# Patient Record
Sex: Female | Born: 1995 | Race: Black or African American | Hispanic: No | Marital: Single | Smoking: Never smoker
Health system: Southern US, Community
[De-identification: ages and names within clinical notes are randomized; demographics above are authoritative.]

## PROBLEM LIST (undated history)

## (undated) DIAGNOSIS — N200 Calculus of kidney: Secondary | ICD-10-CM

## (undated) DIAGNOSIS — A749 Chlamydial infection, unspecified: Secondary | ICD-10-CM

---

## 2019-02-10 ENCOUNTER — Ambulatory Visit (HOSPITAL_COMMUNITY): Payer: Self-pay

## 2019-02-10 ENCOUNTER — Encounter (HOSPITAL_COMMUNITY): Payer: Self-pay

## 2019-02-10 ENCOUNTER — Other Ambulatory Visit: Payer: Self-pay | Admitting: Surgical

## 2019-02-10 ENCOUNTER — Other Ambulatory Visit (HOSPITAL_COMMUNITY): Payer: Self-pay | Admitting: Surgical

## 2019-02-10 DIAGNOSIS — N200 Calculus of kidney: Secondary | ICD-10-CM

## 2019-02-23 ENCOUNTER — Encounter (HOSPITAL_COMMUNITY): Payer: Self-pay | Admitting: *Deleted

## 2019-02-23 ENCOUNTER — Ambulatory Visit (HOSPITAL_COMMUNITY)
Admission: EM | Admit: 2019-02-23 | Discharge: 2019-02-23 | Disposition: A | Discharge status: Home / Self Care | Payer: Medicaid Other | Attending: Emergency Medicine | Admitting: Emergency Medicine

## 2019-02-23 ENCOUNTER — Other Ambulatory Visit: Payer: Self-pay

## 2019-02-23 DIAGNOSIS — N739 Female pelvic inflammatory disease, unspecified: Secondary | ICD-10-CM | POA: Diagnosis not present

## 2019-02-23 DIAGNOSIS — Z202 Contact with and (suspected) exposure to infections with a predominantly sexual mode of transmission: Secondary | ICD-10-CM | POA: Diagnosis present

## 2019-02-23 DIAGNOSIS — N730 Acute parametritis and pelvic cellulitis: Secondary | ICD-10-CM | POA: Insufficient documentation

## 2019-02-23 DIAGNOSIS — Z3202 Encounter for pregnancy test, result negative: Secondary | ICD-10-CM

## 2019-02-23 HISTORY — DX: Chlamydial infection, unspecified: A74.9

## 2019-02-23 LAB — POCT URINALYSIS DIP (DEVICE)
Bilirubin Urine: NEGATIVE
Glucose, UA: NEGATIVE mg/dL
Ketones, ur: 15 mg/dL — AB
Leukocytes,Ua: NEGATIVE
Nitrite: NEGATIVE
Protein, ur: NEGATIVE mg/dL
Specific Gravity, Urine: >=1.03 (ref 1.005–1.030)
Urobilinogen, UA: 0.2 mg/dL (ref 0.0–1.0)
pH: 5.5 (ref 5.0–8.0)

## 2019-02-23 LAB — POCT PREGNANCY, URINE: Preg Test, Ur: NEGATIVE

## 2019-02-23 MED ORDER — METRONIDAZOLE 500 MG PO TABS: 500.0000 mg | ORAL_TABLET | Freq: Two times a day (BID) | ORAL | 0 refills | Status: DC

## 2019-02-23 MED ORDER — CEFTRIAXONE SODIUM 250 MG IJ SOLR
250.0000 mg | Freq: Once | INTRAMUSCULAR | Status: AC
  Administered 2019-02-23: 250 mg via INTRAMUSCULAR

## 2019-02-23 MED ORDER — DOXYCYCLINE HYCLATE 100 MG PO CAPS: 100.0000 mg | ORAL_CAPSULE | Freq: Two times a day (BID) | ORAL | 0 refills | Status: DC

## 2019-02-23 MED ORDER — LIDOCAINE HCL (PF) 1 % IJ SOLN
INTRAMUSCULAR | Status: AC
  Filled 2019-02-23: qty 2

## 2019-02-23 MED ORDER — CEFTRIAXONE SODIUM 250 MG IJ SOLR
INTRAMUSCULAR | Status: AC
  Filled 2019-02-23: qty 250

## 2019-02-23 NOTE — ED Triage Notes (Signed)
C/O gradual onset pelvic pain after waking this AM.  Had chlamydia dx 8/13 - completed course; states all sxs resolved.  Denies any vaginal discharge.  Starting yesterday c/o urinary urgency.  Denies dysuria or fever.  States recently had kidney stones, but stones were passed.

## 2019-02-23 NOTE — ED Provider Notes (Signed)
[facility]    CSN: [id number] Arrival date & time: 02/23/19  1118      History   Chief Complaint Chief Complaint  Patient presents with  . Pelvic Pain    HPI Curly Shoreslon Dancel is a 23 y.o. female.   Patient presents with pelvic pain and urinary urgency for 1 week.  She was treated for chlamydia on 02/06/2019 by a provider in [place]; she is unsure of the name of the medication(s).  [facility] on 01/21/2019 for kidney stone and UTI with Keflex.  Patient states she has an appointment with her GYN on 03/05/2019 in [place].  She denies vaginal discharge, painful urination, abdominal pain, back pain, fever, or other symptoms.  LMP: 02/11/2019.  Patient states she is sexually active with multiple partners and does not use condoms.     Past Medical History:  Diagnosis Date  . Chlamydia     There are no active problems to display for this patient.   History reviewed. No pertinent surgical history.  OB History   No obstetric history on file.      Home Medications    Prior to Admission medications   Medication Sig Start Date End Date Taking? Authorizing Provider  UNKNOWN TO PATIENT BCPs   Yes [provider]  doxycycline (VIBRAMYCIN) 100 MG capsule Take 1 capsule (100 mg total) by mouth 2 (two) times daily. 02/23/19   Mickie Bailate, Kerith Sherley H, NP  metroNIDAZOLE (FLAGYL) 500 MG tablet Take 1 tablet (500 mg total) by mouth 2 (two) times daily. 02/23/19   Mickie Bailate, Andi Layfield H, NP    Family History Family History  Problem Relation Age of Onset  . Healthy Mother   . Healthy Father     Social History Social History   Tobacco Use  . Smoking status: Never Smoker  . Smokeless tobacco: Never Used  Substance Use Topics  . Alcohol use: Yes    Comment: rarely  . Drug use: Yes    Types: Marijuana     Allergies   Patient has no known allergies.   Review of Systems Review of Systems  Constitutional: Negative for chills and fever.  HENT: Negative for ear pain and  sore throat.   Eyes: Negative for pain and visual disturbance.  Respiratory: Negative for cough and shortness of breath.   Cardiovascular: Negative for chest pain and palpitations.  Gastrointestinal: Negative for abdominal pain and vomiting.  Genitourinary: Positive for pelvic pain and urgency. Negative for dysuria, flank pain, frequency, hematuria and vaginal discharge.  Musculoskeletal: Negative for arthralgias and back pain.  Skin: Negative for color change and rash.  Neurological: Negative for seizures and syncope.  All other systems reviewed and are negative.    Physical Exam Triage Vital Signs ED Triage Vitals  Enc Vitals Group     BP 02/23/19 1129 122/76     Pulse Rate 02/23/19 1129 (!) 105     Resp 02/23/19 1129 16     Temp 02/23/19 1129 97.8 F (36.6 C)     Temp Source 02/23/19 1129 Other     SpO2 02/23/19 1129 99 %     Weight --      Height --      Head Circumference --      Peak Flow --      Pain Score 02/23/19 1131 5     Pain Loc --      Pain Edu? --      Excl. in GC? --  No data found.  Updated Vital Signs BP 122/76   Pulse (!) 105   Temp 97.8 F (36.6 C) (Other (Comment))   Resp 16   LMP 02/11/2019 (Exact Date)   SpO2 99%   Visual Acuity Right Eye Distance:   Left Eye Distance:   Bilateral Distance:    Right Eye Near:   Left Eye Near:    Bilateral Near:     Physical Exam Vitals signs and nursing note reviewed.  Constitutional:      General: She is not in acute distress.    Appearance: She is well-developed.  HENT:     Head: Normocephalic and atraumatic.     Mouth/Throat:     Mouth: Mucous membranes are moist.     Pharynx: Oropharynx is clear.  Eyes:     Conjunctiva/sclera: Conjunctivae normal.  Neck:     Musculoskeletal: Neck supple.  Cardiovascular:     Rate and Rhythm: Normal rate and regular rhythm.     Heart sounds: No murmur.  Pulmonary:     Effort: Pulmonary effort is normal. No respiratory distress.     Breath sounds:  Normal breath sounds.  Abdominal:     General: Bowel sounds are normal.     Palpations: Abdomen is soft.     Tenderness: There is no abdominal tenderness. There is no right CVA tenderness, left CVA tenderness, guarding or rebound.  Genitourinary:    Labia:        Right: No rash, tenderness or lesion.        Left: No rash, tenderness or lesion.      Vagina: Vaginal discharge present.     Cervix: Normal.     Uterus: Normal.      Adnexa:        Right: Tenderness present.        Left: Tenderness present.      Comments: Moderate amount of thin white vaginal discharge. Skin:    General: Skin is warm and dry.  Neurological:     Mental Status: She is alert.      UC Treatments / Results  Labs (all labs ordered are listed, but only abnormal results are displayed) Labs Reviewed  POCT URINALYSIS DIP (DEVICE) - Abnormal; Notable for the following components:      Result Value   Ketones, ur 15 (*)    Hgb urine dipstick MODERATE (*)    All other components within normal limits  POC URINE PREG, ED  POCT PREGNANCY, URINE  CERVICOVAGINAL ANCILLARY ONLY    EKG   Radiology No results found.  Procedures Procedures (including critical care time)  Medications Ordered in UC Medications  cefTRIAXone (ROCEPHIN) injection 250 mg (has no administration in time range)  cefTRIAXone (ROCEPHIN) 250 MG injection (has no administration in time range)  lidocaine (PF) (XYLOCAINE) 1 % injection (has no administration in time range)    Initial Impression / Assessment and Plan / UC Course  I have reviewed the triage vital signs and the nursing notes.  Pertinent labs & imaging results that were available during my care of the patient were reviewed by me and considered in my medical decision making (see chart for details).   Urine pregnancy negative.  Urine dip negative for LE and nitrite but has moderate blood.  Acute pelvic inflammatory disease.  Possible exposure to STD.  Treating with  Rocephin, doxycycline, metronidazole.  Instructed patient not to have sex for 7 days.  Discussed with her that her STD test are pending and  that we will call her if they are positive.  Discussed that she may need additional treatment and her partner may also need treatment at that time.  Instructed patient to follow-up with her GYN in [place] as scheduled on 03/05/2019.  Patient agrees with treatment plan.     Final Clinical Impressions(s) / UC Diagnoses   Final diagnoses:  PID (acute pelvic inflammatory disease)  Possible exposure to STD     Discharge Instructions     Given an injection of an antibiotic called Rocephin today.    Take the prescribed doxycycline and metronidazole.  Do not have sex for 7 days.    Your STD tests are pending.  If your test results are positive, we will call you.  You may need additional treatment and your partner may also need treatment.      Follow-up with your GYN as scheduled on 03/05/2019.       ED Prescriptions    Medication Sig Dispense Auth. Provider   doxycycline (VIBRAMYCIN) 100 MG capsule Take 1 capsule (100 mg total) by mouth 2 (two) times daily. 20 capsule Mickie Bail, NP   metroNIDAZOLE (FLAGYL) 500 MG tablet Take 1 tablet (500 mg total) by mouth 2 (two) times daily. 14 tablet Mickie Bail, NP     Controlled Substance Prescriptions [place] Controlled Substance Registry consulted? Not Applicable   Mickie Bail, NP 02/23/19 1207

## 2019-02-23 NOTE — Discharge Instructions (Addendum)
Given an injection of an antibiotic called Rocephin today.    Take the prescribed doxycycline and metronidazole.  Do not have sex for 7 days.    Your STD tests are pending.  If your test results are positive, we will call you.  You may need additional treatment and your partner may also need treatment.      Follow-up with your GYN as scheduled on 03/05/2019.

## 2019-02-28 LAB — CERVICOVAGINAL ANCILLARY ONLY
Bacterial vaginitis: NEGATIVE
Candida vaginitis: NEGATIVE
Chlamydia: NEGATIVE
Neisseria Gonorrhea: NEGATIVE
Trichomonas: NEGATIVE

## 2020-11-30 ENCOUNTER — Encounter (HOSPITAL_COMMUNITY): Payer: Self-pay

## 2020-11-30 ENCOUNTER — Other Ambulatory Visit: Payer: Self-pay

## 2020-11-30 ENCOUNTER — Ambulatory Visit (HOSPITAL_COMMUNITY)
Admission: EM | Admit: 2020-11-30 | Discharge: 2020-11-30 | Disposition: A | Discharge status: Home / Self Care | Payer: Medicaid Other | Attending: Family Medicine | Admitting: Family Medicine

## 2020-11-30 DIAGNOSIS — Z113 Encounter for screening for infections with a predominantly sexual mode of transmission: Secondary | ICD-10-CM | POA: Diagnosis not present

## 2020-11-30 DIAGNOSIS — R300 Dysuria: Secondary | ICD-10-CM | POA: Diagnosis not present

## 2020-11-30 LAB — POCT URINALYSIS DIPSTICK, ED / UC
Bilirubin Urine: NEGATIVE
Glucose, UA: NEGATIVE mg/dL
Ketones, ur: 40 mg/dL — AB
Nitrite: NEGATIVE
Protein, ur: NEGATIVE mg/dL
Specific Gravity, Urine: 1.015 (ref 1.005–1.030)
Urobilinogen, UA: 0.2 mg/dL (ref 0.0–1.0)
pH: 6 (ref 5.0–8.0)

## 2020-11-30 LAB — POC URINE PREG, ED: Preg Test, Ur: NEGATIVE

## 2020-11-30 MED ORDER — CEPHALEXIN 500 MG PO CAPS: 500.0000 mg | ORAL_CAPSULE | Freq: Two times a day (BID) | ORAL | 0 refills | Status: DC

## 2020-11-30 NOTE — ED Triage Notes (Signed)
Pt states she thinks she had a UTI. She states she started taking AZO on Sunday and has felt relief. Pt states she felt pain and tingling after urinating and states she has had the urgency to go constantly.

## 2020-12-01 ENCOUNTER — Telehealth (HOSPITAL_COMMUNITY): Payer: Self-pay | Admitting: Emergency Medicine

## 2020-12-01 LAB — CERVICOVAGINAL ANCILLARY ONLY
Bacterial Vaginitis (gardnerella): POSITIVE — AB
Candida Glabrata: NEGATIVE
Candida Vaginitis: NEGATIVE
Chlamydia: POSITIVE — AB
Comment: NEGATIVE
Comment: NEGATIVE
Comment: NEGATIVE
Comment: NEGATIVE
Comment: NEGATIVE
Comment: NORMAL
Neisseria Gonorrhea: NEGATIVE
Trichomonas: NEGATIVE

## 2020-12-01 MED ORDER — METRONIDAZOLE 0.75 % VA GEL: 1.0000 | Freq: Every day | VAGINAL | 0 refills | Status: AC

## 2020-12-01 MED ORDER — DOXYCYCLINE HYCLATE 100 MG PO CAPS: 100.0000 mg | ORAL_CAPSULE | Freq: Two times a day (BID) | ORAL | 0 refills | Status: AC

## 2020-12-02 NOTE — ED Provider Notes (Addendum)
[facility]    CSN: [id number] Arrival date & time: 11/30/20  1942      History   Chief Complaint No chief complaint on file.   HPI Katherine Bailey is a 25 y.o. female.   Patient presenting today with several day history of urinary urgency and frequency.  Some mild tingling after urinating several times but not consistently.  She states that she went on vacation and upon returning had the symptoms.  Took some Azo for several days which did help with symptoms but they came back after she stopped.  Denies fever, chills, pelvic or abdominal pain, flank pain, nausea vomiting or diarrhea.  No concern for STDs today.     Past Medical History:  Diagnosis Date   Chlamydia     There are no problems to display for this patient.   History reviewed. No pertinent surgical history.  OB History   No obstetric history on file.      Home Medications    Prior to Admission medications   Medication Sig Start Date End Date Taking? Authorizing Provider  doxycycline (VIBRAMYCIN) 100 MG capsule Take 1 capsule (100 mg total) by mouth 2 (two) times daily for 7 days. 12/01/20 12/08/20  Merrilee Jansky, MD  metroNIDAZOLE (FLAGYL) 500 MG tablet Take 1 tablet (500 mg total) by mouth 2 (two) times daily. 02/23/19   Mickie Bail, NP  metroNIDAZOLE (METROGEL VAGINAL) 0.75 % vaginal gel Place 1 Applicatorful vaginally at bedtime for 5 days. 12/01/20 12/06/20  Lamptey, Britta Mccreedy, MD  UNKNOWN TO PATIENT BCPs    [provider]    Family History Family History  Problem Relation Age of Onset   Healthy Mother    Healthy Father     Social History Social History   Tobacco Use   Smoking status: Never   Smokeless tobacco: Never  Vaping Use   Vaping Use: Never used  Substance Use Topics   Alcohol use: Yes    Comment: rarely   Drug use: Yes    Types: Marijuana     Allergies   Patient has no known allergies.   Review of Systems Review of Systems Per HPI  Physical  Exam Triage Vital Signs ED Triage Vitals  Enc Vitals Group     BP 11/30/20 2008 136/90     Pulse Rate 11/30/20 2008 (!) 110     Resp 11/30/20 2008 19     Temp 11/30/20 2008 99 F (37.2 C)     Temp Source 11/30/20 2008 Oral     SpO2 11/30/20 2008 98 %     Weight --      Height --      Head Circumference --      Peak Flow --      Pain Score 11/30/20 2007 0     Pain Loc --      Pain Edu? --      Excl. in GC? --    No data found.  Updated Vital Signs BP 136/90 (BP Location: Left Arm)   Pulse (!) 110   Temp 99 F (37.2 C) (Oral)   Resp 19   LMP 10/06/2014 Comment: Pt is on birth control currently  SpO2 98%   Visual Acuity Right Eye Distance:   Left Eye Distance:   Bilateral Distance:    Right Eye Near:   Left Eye Near:    Bilateral Near:     Physical Exam Vitals and nursing note reviewed.  Constitutional:  Appearance: Normal appearance. She is not ill-appearing.  HENT:     Head: Atraumatic.     Mouth/Throat:     Mouth: Mucous membranes are moist.     Pharynx: Oropharynx is clear.  Eyes:     Extraocular Movements: Extraocular movements intact.     Conjunctiva/sclera: Conjunctivae normal.  Cardiovascular:     Rate and Rhythm: Normal rate and regular rhythm.     Heart sounds: Normal heart sounds.  Pulmonary:     Effort: Pulmonary effort is normal. No respiratory distress.     Breath sounds: Normal breath sounds. No wheezing or rales.  Abdominal:     General: Bowel sounds are normal. There is no distension.     Palpations: Abdomen is soft.     Tenderness: There is no abdominal tenderness. There is no right CVA tenderness, left CVA tenderness or guarding.  Genitourinary:    Comments: GU exam deferred, self swab performed Musculoskeletal:        General: Normal range of motion.     Cervical back: Normal range of motion and neck supple.  Skin:    General: Skin is warm and dry.  Neurological:     Mental Status: She is alert and oriented to person, place,  and time.  Psychiatric:        Mood and Affect: Mood normal.        Thought Content: Thought content normal.        Judgment: Judgment normal.   UC Treatments / Results  Labs (all labs ordered are listed, but only abnormal results are displayed) Labs Reviewed  POCT URINALYSIS DIPSTICK, ED / UC - Abnormal; Notable for the following components:      Result Value   Ketones, ur 40 (*)    Hgb urine dipstick SMALL (*)    Leukocytes,Ua SMALL (*)    All other components within normal limits  CERVICOVAGINAL ANCILLARY ONLY - Abnormal; Notable for the following components:   Bacterial Vaginitis (gardnerella) Positive (*)    Chlamydia Positive (*)    All other components within normal limits  POC URINE PREG, ED    EKG   Radiology No results found.  Procedures Procedures (including critical care time)  Medications Ordered in UC Medications - No data to display  Initial Impression / Assessment and Plan / UC Course  I have reviewed the triage vital signs and the nursing notes.  Pertinent labs & imaging results that were available during my care of the patient were reviewed by me and considered in my medical decision making (see chart for details).     Mildly tachycardic in triage but otherwise vital signs reassuring.  Exam benign.  Urine pregnancy negative.  UA today showing leukocytes and hemoglobin, urine culture pending to further differentiate if this is a urinary tract infection or possibly a vaginal infection.  Vaginal swab also pending to rule out STDs and other vaginal infections.  Will initiate Keflex in the meantime in case treat urinary tract infection.  Discussed abstinence until results return, good vaginal hygiene.  Follow-up if acutely worsening in the meantime.  Final Clinical Impressions(s) / UC Diagnoses   Final diagnoses:  Dysuria   Discharge Instructions   None    ED Prescriptions     Medication Sig Dispense Auth. Provider   cephALEXin (KEFLEX) 500 MG  capsule Take 1 capsule (500 mg total) by mouth 2 (two) times daily. 10 capsule Particia Nearing, [place]      PDMP not reviewed this encounter.  Particia Nearing, [place] 12/02/20 1533    Particia Nearing, [place] 12/02/20 1534

## 2020-12-13 ENCOUNTER — Ambulatory Visit (HOSPITAL_COMMUNITY)
Admission: EM | Admit: 2020-12-13 | Discharge: 2020-12-13 | Disposition: A | Discharge status: Home / Self Care | Payer: Medicaid Other | Attending: Emergency Medicine | Admitting: Emergency Medicine

## 2020-12-13 ENCOUNTER — Other Ambulatory Visit: Payer: Self-pay

## 2020-12-13 ENCOUNTER — Encounter (HOSPITAL_COMMUNITY): Payer: Self-pay

## 2020-12-13 DIAGNOSIS — Z113 Encounter for screening for infections with a predominantly sexual mode of transmission: Secondary | ICD-10-CM | POA: Insufficient documentation

## 2020-12-13 DIAGNOSIS — R0981 Nasal congestion: Secondary | ICD-10-CM | POA: Insufficient documentation

## 2020-12-13 MED ORDER — SEREVENT DISKUS 50 MCG/DOSE IN AEPB: 1.0000 | INHALATION_SPRAY | Freq: Two times a day (BID) | RESPIRATORY_TRACT | 12 refills | Status: DC

## 2020-12-13 NOTE — ED Triage Notes (Signed)
Pt presents for STD retesting after being treated for chlamydia X 2 weeks ago.  Pt also presents with congestion that causes chest discomfort; pt states her kids recently had RSV

## 2020-12-13 NOTE — Discharge Instructions (Addendum)
Sti screen pending 2-3 days, you will be called ifb positive for treatment, no sex until lab results   Respiratory panel pending you will be called if positive   Can use inhaler 1 puff twice a day as needed for shortness of breath   Continue use of cold medications to help with congestion

## 2020-12-14 LAB — RESPIRATORY PANEL BY PCR

## 2020-12-14 LAB — CERVICOVAGINAL ANCILLARY ONLY
Bacterial Vaginitis (gardnerella): NEGATIVE
Candida Glabrata: NEGATIVE
Candida Vaginitis: NEGATIVE
Chlamydia: NEGATIVE
Comment: NEGATIVE
Comment: NEGATIVE
Comment: NEGATIVE
Comment: NEGATIVE
Comment: NEGATIVE
Comment: NORMAL
Neisseria Gonorrhea: NEGATIVE
Trichomonas: NEGATIVE

## 2020-12-14 NOTE — ED Provider Notes (Signed)
[facility]    CSN: [id number] Arrival date & time: 12/13/20  1925      History   Chief Complaint Chief Complaint  Patient presents with   STD Testing   Congestion    HPI Katherine Bailey is a 25 y.o. female.   Patient presents with nasal congestion, ear fullness, chest tightness, intermittent shortness of breath for 3 days. Feels she isn't getting enough air due to the congestion. Denies fever, chills, body aches, headache, sore throat, cough, abdominal pain, nausea, vomiting, diarrhea. 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 Has been taking cold and flu medication with some relief. Unvaccinated.   Requesting STD retesting after treatment for chlamydia. Completed medication as prescribed. Has not had sex since treatment.  No symptoms. 1 partner, no condom use.   Past Medical History:  Diagnosis Date   Chlamydia     There are no problems to display for this patient.   History reviewed. No pertinent surgical history.  OB History   No obstetric history on file.      Home Medications    Prior to Admission medications   Medication Sig Start Date End Date Taking? Authorizing Provider  salmeterol (SEREVENT DISKUS) 50 MCG/DOSE diskus inhaler Inhale 1 puff into the lungs 2 (two) times daily. 12/13/20  Yes Hien Perreira R, NP  metroNIDAZOLE (FLAGYL) 500 MG tablet Take 1 tablet (500 mg total) by mouth 2 (two) times daily. 02/23/19   Mickie Bail, NP  UNKNOWN TO PATIENT BCPs    [provider]    Family History Family History  Problem Relation Age of Onset   Healthy Mother    Healthy Father     Social History Social History   Tobacco Use   Smoking status: Never   Smokeless tobacco: Never  Vaping Use   Vaping Use: Never used  Substance Use Topics   Alcohol use: Yes    Comment: rarely   Drug use: Yes    Types: Marijuana     Allergies   Patient has no known allergies.   Review of Systems Review of Systems Defer to HPI     Physical Exam Triage Vital Signs ED Triage Vitals  Enc Vitals Group     BP 12/13/20 1947 121/82     Pulse Rate 12/13/20 1947 (!) 102     Resp 12/13/20 1947 18     Temp 12/13/20 1947 98.8 F (37.1 C)     Temp Source 12/13/20 1947 Oral     SpO2 12/13/20 1947 100 %     Weight --      Height --      Head Circumference --      Peak Flow --      Pain Score 12/13/20 1948 4     Pain Loc --      Pain Edu? --      Excl. in GC? --    No data found.  Updated Vital Signs BP 121/82 (BP Location: Right Arm)   Pulse (!) 102   Temp 98.8 F (37.1 C) (Oral)   Resp 18   LMP 12/07/2020   SpO2 100%   Visual Acuity Right Eye Distance:   Left Eye Distance:   Bilateral Distance:    Right Eye Near:   Left Eye Near:    Bilateral Near:     Physical Exam Constitutional:      Appearance: Normal appearance. She is normal weight.  HENT:     Head: Normocephalic.  Right Ear: Tympanic membrane, ear canal and external ear normal.     Left Ear: Tympanic membrane, ear canal and external ear normal.     Nose: Congestion present. No rhinorrhea.     Mouth/Throat:     Mouth: Mucous membranes are moist.     Pharynx: Oropharynx is clear.  Eyes:     Extraocular Movements: Extraocular movements intact.  Cardiovascular:     Rate and Rhythm: Normal rate and regular rhythm.     Pulses: Normal pulses.     Heart sounds: Normal heart sounds.  Pulmonary:     Effort: Pulmonary effort is normal.     Breath sounds: Normal breath sounds.  Musculoskeletal:        General: Normal range of motion.     Cervical back: Normal range of motion and neck supple.  Skin:    General: Skin is warm and dry.  Neurological:     Mental Status: She is alert and oriented to person, place, and time. Mental status is at baseline.  Psychiatric:        Mood and Affect: Mood normal.        Behavior: Behavior normal.     UC Treatments / Results  Labs (all labs ordered are listed, but only abnormal results are  displayed) Labs Reviewed  RESPIRATORY PANEL BY PCR - Abnormal; Notable for the following components:      Result Value   Respiratory Syncytial Virus DETECTED (*)    All other components within normal limits  CERVICOVAGINAL ANCILLARY ONLY    EKG   Radiology No results found.  Procedures Procedures (including critical care time)  Medications Ordered in UC Medications - No data to display  Initial Impression / Assessment and Plan / UC Course  I have reviewed the triage vital signs and the nursing notes.  Pertinent labs & imaging results that were available during my care of the patient were reviewed by me and considered in my medical decision making (see chart for details).  Nasal congestion Routine screening for STI  Respiratory panel- pending Salmeterol inhaler bid prn Sti screening pending, will treat per protocol, advised abstinence until labs result Continue use of otc medications   Final Clinical Impressions(s) / UC Diagnoses   Final diagnoses:  Routine screening for STI (sexually transmitted infection)  Nasal congestion     Discharge Instructions      Sti screen pending 2-3 days, you will be called ifb positive for treatment, no sex until lab results   Respiratory panel pending you will be called if positive   Can use inhaler 1 puff twice a day as needed for shortness of breath   Continue use of cold medications to help with congestion    ED Prescriptions     Medication Sig Dispense Auth. Provider   salmeterol (SEREVENT DISKUS) 50 MCG/DOSE diskus inhaler Inhale 1 puff into the lungs 2 (two) times daily. 1 each Valinda Hoar, NP      PDMP not reviewed this encounter.   Valinda Hoar, [place] 12/14/20 [phone]

## 2021-02-27 ENCOUNTER — Ambulatory Visit (HOSPITAL_COMMUNITY)
Admission: EM | Admit: 2021-02-27 | Discharge: 2021-02-27 | Disposition: A | Discharge status: Home / Self Care | Payer: Medicaid Other | Attending: Internal Medicine | Admitting: Internal Medicine

## 2021-02-27 ENCOUNTER — Encounter (HOSPITAL_COMMUNITY): Payer: Self-pay | Admitting: *Deleted

## 2021-02-27 DIAGNOSIS — Z113 Encounter for screening for infections with a predominantly sexual mode of transmission: Secondary | ICD-10-CM

## 2021-02-27 DIAGNOSIS — R109 Unspecified abdominal pain: Secondary | ICD-10-CM | POA: Diagnosis present

## 2021-02-27 DIAGNOSIS — R059 Cough, unspecified: Secondary | ICD-10-CM

## 2021-02-27 LAB — POCT URINALYSIS DIPSTICK, ED / UC
Glucose, UA: NEGATIVE mg/dL
Ketones, ur: >=160 mg/dL — AB
Leukocytes,Ua: NEGATIVE
Nitrite: NEGATIVE
Protein, ur: NEGATIVE mg/dL
Specific Gravity, Urine: 1.02 (ref 1.005–1.030)
Urobilinogen, UA: 0.2 mg/dL (ref 0.0–1.0)
pH: 6 (ref 5.0–8.0)

## 2021-02-27 LAB — POC URINE PREG, ED: Preg Test, Ur: NEGATIVE

## 2021-02-27 MED ORDER — BENZONATATE 100 MG PO CAPS: 100.0000 mg | ORAL_CAPSULE | Freq: Three times a day (TID) | ORAL | 0 refills | Status: DC

## 2021-02-27 MED ORDER — TAMSULOSIN HCL 0.4 MG PO CAPS: 0.4000 mg | ORAL_CAPSULE | Freq: Every day | ORAL | 0 refills | Status: DC

## 2021-02-27 MED ORDER — FLUTICASONE PROPIONATE 50 MCG/ACT NA SUSP: 2.0000 | Freq: Every day | NASAL | 0 refills | Status: DC

## 2021-02-27 NOTE — ED Provider Notes (Signed)
[facility]    CSN: [id number] Arrival date & time: 02/27/21  1720      History   Chief Complaint Chief Complaint  Patient presents with   Cough   SEXUALLY TRANSMITTED DISEASE    HPI Katherine Bailey is a 25 y.o. female.   HPI  Cough: Pt reports a cough for the past few weeks off and on.  She states that the cough will go away and then comes back.  She denies any hemoptysis, fevers, shortness of breath, wheezing or chest pain.  She has tried TheraFlu for symptoms along with Mucinex without any relief.  She reports that she is not a smoker.  She also reports that she has had some intermittent right sided flank pain which is not currently present.  No dysuria, abdominal pain, vomiting or diarrhea.  She also asks for an STI test without symptoms or exposures.   Past Medical History:  Diagnosis Date   Chlamydia     There are no problems to display for this patient.   History reviewed. No pertinent surgical history.  OB History   No obstetric history on file.      Home Medications    Prior to Admission medications   Medication Sig Start Date End Date Taking? Authorizing Provider  benzonatate (TESSALON) 100 MG capsule Take 1 capsule (100 mg total) by mouth every 8 (eight) hours. 02/27/21  Yes Tc Kapusta M, PA-C  fluticasone [facility]) 50 MCG/ACT nasal spray Place 2 sprays into both nostrils daily. 02/27/21  Yes Braian Tijerina, Maralyn Sago M, PA-C  tamsulosin (FLOMAX) 0.4 MG CAPS capsule Take 1 capsule (0.4 mg total) by mouth daily after supper. 02/27/21  Yes Anner Baity M, PA-C  metroNIDAZOLE (FLAGYL) 500 MG tablet Take 1 tablet (500 mg total) by mouth 2 (two) times daily. 02/23/19   Mickie Bail, NP  salmeterol (SEREVENT DISKUS) 50 MCG/DOSE diskus inhaler Inhale 1 puff into the lungs 2 (two) times daily. 12/13/20   White, Elita Boone, NP  UNKNOWN TO PATIENT BCPs    [provider]    Family History Family History  Problem Relation Age of Onset   Healthy Mother     Healthy Father     Social History Social History   Tobacco Use   Smoking status: Never   Smokeless tobacco: Never  Vaping Use   Vaping Use: Never used  Substance Use Topics   Alcohol use: Yes    Comment: rarely   Drug use: Yes    Types: Marijuana     Allergies   Patient has no known allergies.   Review of Systems Review of Systems  As stated above in HPI Physical Exam Triage Vital Signs ED Triage Vitals [02/27/21 1810]  Enc Vitals Group     BP      Pulse      Resp      Temp      Temp src      SpO2      Weight      Height      Head Circumference      Peak Flow      Pain Score 7     Pain Loc      Pain Edu?      Excl. in GC?    No data found.  Updated Vital Signs BP 124/79 (BP Location: Left Arm)   Pulse 89   Temp 99 F (37.2 C) (Oral)   Resp 18   LMP 12/07/2020 Comment: BCP  SpO2 98%   Physical Exam Vitals and nursing note reviewed.  Constitutional:      General: She is not in acute distress.    Appearance: Normal appearance. She is not ill-appearing, toxic-appearing or diaphoretic.  HENT:     Head: Normocephalic and atraumatic.     Right Ear: Tympanic membrane normal.     Left Ear: Tympanic membrane normal.     Nose: Nose normal. No congestion or rhinorrhea.     Mouth/Throat:     Mouth: Mucous membranes are moist.     Pharynx: Oropharynx is clear. No oropharyngeal exudate or posterior oropharyngeal erythema.  Eyes:     Extraocular Movements: Extraocular movements intact.     Pupils: Pupils are equal, round, and reactive to light.  Cardiovascular:     Rate and Rhythm: Normal rate and regular rhythm.     Heart sounds: Normal heart sounds.  Pulmonary:     Effort: Pulmonary effort is normal.     Breath sounds: Normal breath sounds.  Abdominal:     General: Bowel sounds are normal. There is no distension.     Palpations: Abdomen is soft. There is no mass.     Tenderness: There is no abdominal tenderness. There is no right CVA tenderness,  left CVA tenderness, guarding or rebound.     Hernia: No hernia is present.  Musculoskeletal:     Cervical back: Normal range of motion and neck supple. No tenderness.  Lymphadenopathy:     Cervical: No cervical adenopathy.  Skin:    General: Skin is warm.  Neurological:     Mental Status: She is alert and oriented to person, place, and time.     UC Treatments / Results  Labs (all labs ordered are listed, but only abnormal results are displayed) Labs Reviewed  POCT URINALYSIS DIPSTICK, ED / UC - Abnormal; Notable for the following components:      Result Value   Bilirubin Urine SMALL (*)    Ketones, ur >=160 (*)    Hgb urine dipstick MODERATE (*)    All other components within normal limits  POC URINE PREG, ED  CERVICOVAGINAL ANCILLARY ONLY    EKG   Radiology No results found.  Procedures Procedures (including critical care time)  Medications Ordered in UC Medications - No data to display  Initial Impression / Assessment and Plan / UC Course  I have reviewed the triage vital signs and the nursing notes.  Pertinent labs & imaging results that were available during my care of the patient were reviewed by me and considered in my medical decision making (see chart for details).     New.  Sending in Hope and [place] for her to start for her cough.  Should cough fail to improve or she have any fevers, shortness of breath she should come to our office, the ER or see a pulmonologist.  UA suggestive of possible stone- starting on flomax and fluids.  STI test pending Final Clinical Impressions(s) / UC Diagnoses   Final diagnoses:  Cough  Routine screening for STI (sexually transmitted infection)  Flank pain   Discharge Instructions   None    ED Prescriptions     Medication Sig Dispense Auth. Provider   benzonatate (TESSALON) 100 MG capsule Take 1 capsule (100 mg total) by mouth every 8 (eight) hours. 21 capsule Lenzie Sandler M, PA-C   fluticasone [facility])  50 MCG/ACT nasal spray Place 2 sprays into both nostrils daily. 16 mL Rushie Chestnut, [place]  tamsulosin (FLOMAX) 0.4 MG CAPS capsule Take 1 capsule (0.4 mg total) by mouth daily after supper. 30 capsule Rushie Chestnut, [place]      PDMP not reviewed this encounter.   Rushie Chestnut, [place] 02/27/21 1835

## 2021-02-27 NOTE — ED Triage Notes (Signed)
Pt reports Rt side pain and recurrent cough and wants a STD test

## 2021-03-01 LAB — CERVICOVAGINAL ANCILLARY ONLY
Bacterial Vaginitis (gardnerella): POSITIVE — AB
Candida Glabrata: NEGATIVE
Candida Vaginitis: NEGATIVE
Chlamydia: NEGATIVE
Comment: NEGATIVE
Comment: NEGATIVE
Comment: NEGATIVE
Comment: NEGATIVE
Comment: NEGATIVE
Comment: NORMAL
Neisseria Gonorrhea: NEGATIVE
Trichomonas: NEGATIVE

## 2021-03-01 LAB — URINE CULTURE

## 2021-03-04 ENCOUNTER — Telehealth (HOSPITAL_COMMUNITY): Payer: Self-pay

## 2021-03-04 MED ORDER — METRONIDAZOLE 500 MG PO TABS: 500.0000 mg | ORAL_TABLET | Freq: Two times a day (BID) | ORAL | 0 refills | Status: DC

## 2021-04-05 ENCOUNTER — Ambulatory Visit (HOSPITAL_COMMUNITY)
Admission: EM | Admit: 2021-04-05 | Discharge: 2021-04-05 | Disposition: A | Discharge status: Home / Self Care | Payer: Medicaid Other | Attending: Student | Admitting: Student

## 2021-04-05 ENCOUNTER — Encounter (HOSPITAL_COMMUNITY): Payer: Self-pay | Admitting: Emergency Medicine

## 2021-04-05 ENCOUNTER — Other Ambulatory Visit: Payer: Self-pay

## 2021-04-05 DIAGNOSIS — B9689 Other specified bacterial agents as the cause of diseases classified elsewhere: Secondary | ICD-10-CM | POA: Diagnosis not present

## 2021-04-05 DIAGNOSIS — N760 Acute vaginitis: Secondary | ICD-10-CM | POA: Diagnosis not present

## 2021-04-05 LAB — POC URINE PREG, ED: Preg Test, Ur: NEGATIVE

## 2021-04-05 MED ORDER — METRONIDAZOLE 500 MG PO TABS: 500.0000 mg | ORAL_TABLET | Freq: Two times a day (BID) | ORAL | 0 refills | Status: DC

## 2021-04-05 NOTE — Discharge Instructions (Addendum)
-  For bacterial vaginosis, start the antibiotic-Flagyl (metronidazole), 2 pills daily for 7 days.  You can take this with food if you have a sensitive stomach.  Avoid alcohol while taking this medication and for 2 days after as this will cause severe nausea and vomiting. -Follow-up with gynecologist as scheduled on 10/17

## 2021-04-05 NOTE — ED Triage Notes (Signed)
Pt c/o right side pains for several days as well as her underwear being saturated with clear discharge. Denies n/v/d, urinary or odor with discharge.

## 2021-04-05 NOTE — ED Provider Notes (Signed)
[facility]    CSN: [id number] Arrival date & time: 04/05/21  1806      History   Chief Complaint Chief Complaint  Patient presents with   Abdominal Pain    HPI Katherine Bailey is a 25 y.o. female presenting with vaginitis.  Medical history recurrent BV, chlamydia.  Describes about 3 days of copious watery discharge and crampy right-sided abdominal pain.  Last treated for BV about 1 month ago with resolution of symptoms, she states that this is a recurrent issue for her.  She already has a follow-up with her OB/GYN scheduled in about 1 week.  Has been tried on both Flagyl p.o. and Flagyl gel, improvement but symptoms always return.  She is on OCP for contraception.  Denies new partners, STI risk. Denies hematuria, dysuria, frequency, urgency, back pain, n/v/d, fevers/chills, abdnormal vaginal rashes/lesions.    HPI  Past Medical History:  Diagnosis Date   Chlamydia     There are no problems to display for this patient.   History reviewed. No pertinent surgical history.  OB History   No obstetric history on file.      Home Medications    Prior to Admission medications   Medication Sig Start Date End Date Taking? Authorizing Provider  metroNIDAZOLE (FLAGYL) 500 MG tablet Take 1 tablet (500 mg total) by mouth 2 (two) times daily. Avoid alcohol while taking this medication and for 2 days after 04/05/21  Yes Rhys Martini, PA-C  benzonatate (TESSALON) 100 MG capsule Take 1 capsule (100 mg total) by mouth every 8 (eight) hours. 02/27/21   Rushie Chestnut, PA-C  fluticasone (FLONASE) 50 MCG/ACT nasal spray Place 2 sprays into both nostrils daily. 02/27/21   Rushie Chestnut, PA-C  salmeterol (SEREVENT DISKUS) 50 MCG/DOSE diskus inhaler Inhale 1 puff into the lungs 2 (two) times daily. 12/13/20   Valinda Hoar, NP  tamsulosin (FLOMAX) 0.4 MG CAPS capsule Take 1 capsule (0.4 mg total) by mouth daily after supper. 02/27/21   Rushie Chestnut, PA-C  UNKNOWN TO  PATIENT BCPs    [provider]    Family History Family History  Problem Relation Age of Onset   Healthy Mother    Healthy Father     Social History Social History   Tobacco Use   Smoking status: Never   Smokeless tobacco: Never  Vaping Use   Vaping Use: Never used  Substance Use Topics   Alcohol use: Yes    Comment: rarely   Drug use: Yes    Types: Marijuana     Allergies   Patient has no known allergies.   Review of Systems Review of Systems  Constitutional:  Negative for chills and fever.  HENT:  Negative for sore throat.   Eyes:  Negative for pain and redness.  Respiratory:  Negative for shortness of breath.   Cardiovascular:  Negative for chest pain.  Gastrointestinal:  Positive for abdominal pain. Negative for diarrhea, nausea and vomiting.  Genitourinary:  Positive for vaginal discharge. Negative for decreased urine volume, difficulty urinating, dysuria, flank pain, frequency, genital sores, hematuria, urgency, vaginal bleeding and vaginal pain.  Musculoskeletal:  Negative for back pain.  Skin:  Negative for rash.  All other systems reviewed and are negative.   Physical Exam Triage Vital Signs ED Triage Vitals  Enc Vitals Group     BP 04/05/21 1836 125/81     Pulse Rate 04/05/21 1836 (!) 122     Resp 04/05/21 1836 17  Temp 04/05/21 1836 99 F (37.2 C)     Temp Source 04/05/21 1836 Oral     SpO2 04/05/21 1836 97 %     Weight --      Height --      Head Circumference --      Peak Flow --      Pain Score 04/05/21 1835 6     Pain Loc --      Pain Edu? --      Excl. in GC? --    No data found.  Updated Vital Signs BP 125/81 (BP Location: Right Arm)   Pulse (!) 122   Temp 99 F (37.2 C) (Oral)   Resp 17   SpO2 97%   Visual Acuity Right Eye Distance:   Left Eye Distance:   Bilateral Distance:    Right Eye Near:   Left Eye Near:    Bilateral Near:     Physical Exam Vitals reviewed.  Constitutional:      General: She is  not in acute distress.    Appearance: Normal appearance. She is not ill-appearing.  HENT:     Head: Normocephalic and atraumatic.     Mouth/Throat:     Mouth: Mucous membranes are moist.     Comments: Moist mucous membranes Eyes:     Extraocular Movements: Extraocular movements intact.     Pupils: Pupils are equal, round, and reactive to light.  Cardiovascular:     Rate and Rhythm: Normal rate and regular rhythm.     Heart sounds: Normal heart sounds.  Pulmonary:     Effort: Pulmonary effort is normal.     Breath sounds: Normal breath sounds. No wheezing, rhonchi or rales.  Abdominal:     General: Bowel sounds are normal. There is no distension.     Palpations: Abdomen is soft. There is no mass.     Tenderness: There is no abdominal tenderness. There is no right CVA tenderness, left CVA tenderness, guarding or rebound.     Comments: No reproducible tenderness. No mass.   Genitourinary:    Comments: deferred Skin:    General: Skin is warm.     Capillary Refill: Capillary refill takes less than 2 seconds.     Comments: Good skin turgor  Neurological:     General: No focal deficit present.     Mental Status: She is alert and oriented to person, place, and time.  Psychiatric:        Mood and Affect: Mood normal.        Behavior: Behavior normal.     UC Treatments / Results  Labs (all labs ordered are listed, but only abnormal results are displayed) Labs Reviewed  POC URINE PREG, ED  CERVICOVAGINAL ANCILLARY ONLY    EKG   Radiology No results found.  Procedures Procedures (including critical care time)  Medications Ordered in UC Medications - No data to display  Initial Impression / Assessment and Plan / UC Course  I have reviewed the triage vital signs and the nursing notes.  Pertinent labs & imaging results that were available during my care of the patient were reviewed by me and considered in my medical decision making (see chart for details).     This  patient is a very pleasant 25 y.o. year old female presenting with recurrent BV. Tachycardic at 122, afebrile, no reproducible abd pain or CVAT.Marland Kitchen   Last positive for BV 02/27/21, was treated with flagyl with resolution of symptoms, however they have  now returned. Denies STI risk. Will send self-swab for G/C, trich, yeast, BV testing. Declines HIV, RPR. Safe sex precautions.   U-preg negative.  Flagyl as below.   F/u with gyn as scheduled 10/17.   ED return precautions discussed. Patient verbalizes understanding and agreement.   Coding Level 4 for acute illness with systemic symptoms, and prescription drug management    Final Clinical Impressions(s) / UC Diagnoses   Final diagnoses:  Bacterial vaginosis     Discharge Instructions      -For bacterial vaginosis, start the antibiotic-Flagyl (metronidazole), 2 pills daily for 7 days.  You can take this with food if you have a sensitive stomach.  Avoid alcohol while taking this medication and for 2 days after as this will cause severe nausea and vomiting. -Follow-up with gynecologist as scheduled on 10/17   ED Prescriptions     Medication Sig Dispense Auth. Provider   metroNIDAZOLE (FLAGYL) 500 MG tablet Take 1 tablet (500 mg total) by mouth 2 (two) times daily. Avoid alcohol while taking this medication and for 2 days after 14 tablet Rhys Martini, PA-C      PDMP not reviewed this encounter.   Rhys Martini, PA-C 04/05/21 1922

## 2021-04-06 LAB — CERVICOVAGINAL ANCILLARY ONLY
Bacterial Vaginitis (gardnerella): POSITIVE — AB
Candida Glabrata: NEGATIVE
Candida Vaginitis: NEGATIVE
Chlamydia: NEGATIVE
Comment: NEGATIVE
Comment: NEGATIVE
Comment: NEGATIVE
Comment: NEGATIVE
Comment: NEGATIVE
Comment: NORMAL
Neisseria Gonorrhea: NEGATIVE
Trichomonas: NEGATIVE

## 2021-08-23 ENCOUNTER — Encounter (HOSPITAL_BASED_OUTPATIENT_CLINIC_OR_DEPARTMENT_OTHER): Payer: Self-pay

## 2021-08-23 ENCOUNTER — Other Ambulatory Visit: Payer: Self-pay

## 2021-08-23 ENCOUNTER — Emergency Department (HOSPITAL_BASED_OUTPATIENT_CLINIC_OR_DEPARTMENT_OTHER)
Admission: EM | Admit: 2021-08-23 | Discharge: 2021-08-23 | Disposition: A | Discharge status: Home / Self Care | Payer: Medicaid Other | Attending: Emergency Medicine | Admitting: Emergency Medicine

## 2021-08-23 ENCOUNTER — Emergency Department (HOSPITAL_BASED_OUTPATIENT_CLINIC_OR_DEPARTMENT_OTHER): Payer: Medicaid Other

## 2021-08-23 DIAGNOSIS — N200 Calculus of kidney: Secondary | ICD-10-CM

## 2021-08-23 DIAGNOSIS — N23 Unspecified renal colic: Secondary | ICD-10-CM

## 2021-08-23 DIAGNOSIS — R109 Unspecified abdominal pain: Secondary | ICD-10-CM | POA: Diagnosis present

## 2021-08-23 DIAGNOSIS — N132 Hydronephrosis with renal and ureteral calculous obstruction: Secondary | ICD-10-CM | POA: Insufficient documentation

## 2021-08-23 LAB — URINALYSIS, ROUTINE W REFLEX MICROSCOPIC
Bilirubin Urine: NEGATIVE
Glucose, UA: NEGATIVE mg/dL
Nitrite: NEGATIVE
RBC / HPF: >50 RBC/hpf — ABNORMAL HIGH (ref 0–5)
Specific Gravity, Urine: 1.019 (ref 1.005–1.030)
pH: 5.5 (ref 5.0–8.0)

## 2021-08-23 LAB — PREGNANCY, URINE: Preg Test, Ur: NEGATIVE

## 2021-08-23 MED ORDER — IBUPROFEN 800 MG PO TABS
800.0000 mg | ORAL_TABLET | Freq: Once | ORAL | Status: AC
  Administered 2021-08-23: 800 mg via ORAL
  Filled 2021-08-23: qty 1

## 2021-08-23 MED ORDER — IBUPROFEN 800 MG PO TABS: 800.0000 mg | ORAL_TABLET | Freq: Three times a day (TID) | ORAL | 0 refills | Status: DC | PRN

## 2021-08-23 MED ORDER — DOCUSATE SODIUM 100 MG PO CAPS: 100.0000 mg | ORAL_CAPSULE | Freq: Every day | ORAL | 0 refills | Status: DC | PRN

## 2021-08-23 MED ORDER — OXYCODONE-ACETAMINOPHEN 5-325 MG PO TABS: 1.0000 | ORAL_TABLET | Freq: Three times a day (TID) | ORAL | 0 refills | Status: DC | PRN

## 2021-08-23 MED ORDER — OXYCODONE-ACETAMINOPHEN 5-325 MG PO TABS
1.0000 | ORAL_TABLET | Freq: Once | ORAL | Status: AC
  Administered 2021-08-23: 1 via ORAL
  Filled 2021-08-23: qty 1

## 2021-08-23 MED ORDER — ONDANSETRON 4 MG PO TBDP
4.0000 mg | ORAL_TABLET | Freq: Once | ORAL | Status: AC
  Administered 2021-08-23: 4 mg via ORAL
  Filled 2021-08-23: qty 1

## 2021-08-23 MED ORDER — TAMSULOSIN HCL 0.4 MG PO CAPS: 0.4000 mg | ORAL_CAPSULE | Freq: Every day | ORAL | 0 refills | Status: AC

## 2021-08-23 MED ORDER — ONDANSETRON 4 MG PO TBDP: 4.0000 mg | ORAL_TABLET | Freq: Three times a day (TID) | ORAL | 0 refills | Status: DC | PRN

## 2021-08-23 NOTE — ED Triage Notes (Signed)
Onset three days of right flank pain getting worse and radiating into right lower Abd.  Associated with vomiting and nausea.

## 2021-08-23 NOTE — ED Provider Notes (Signed)
[facility] EMERGENCY DEPT Provider Note   CSN: [id number] Arrival date & time: 08/23/21  0907     History  Chief Complaint  Patient presents with   Flank Pain    Katherine Bailey is a 26 y.o. female with a history of kidney stones presenting to ED with right-sided flank pain.  She reports abrupt, sharp right CVA tenderness and pain 3 days ago.  It seemed of gotten better and then returned significantly yesterday.  Associated with nausea and vomiting.  She reports some stinging with urination as well.  Denies history of abdominal surgeries.  Denies fevers or chills.  She is not on her menstrual period  HPI     Home Medications Prior to Admission medications   Medication Sig Start Date End Date Taking? Authorizing Provider  docusate sodium (COLACE) 100 MG capsule Take 1 capsule (100 mg total) by mouth daily as needed for up to 30 doses for mild constipation. 08/23/21  Yes Sultan Pargas, Kermit Balo, MD  ibuprofen (ADVIL) 800 MG tablet Take 1 tablet (800 mg total) by mouth every 8 (eight) hours as needed for up to 30 doses. 08/23/21  Yes Terald Sleeper, MD  ondansetron (ZOFRAN-ODT) 4 MG disintegrating tablet Take 1 tablet (4 mg total) by mouth every 8 (eight) hours as needed for up to 15 doses for nausea or vomiting. 08/23/21  Yes Klair Leising, Kermit Balo, MD  oxyCODONE-acetaminophen (PERCOCET/ROXICET) 5-325 MG tablet Take 1 tablet by mouth every 8 (eight) hours as needed for up to 10 doses for severe pain. 08/23/21  Yes Shade Kaley, Kermit Balo, MD  tamsulosin (FLOMAX) 0.4 MG CAPS capsule Take 1 capsule (0.4 mg total) by mouth daily after breakfast for 15 doses. Until you pass your stone 08/23/21 09/07/21 Yes Rennie Rouch, Kermit Balo, MD  benzonatate (TESSALON) 100 MG capsule Take 1 capsule (100 mg total) by mouth every 8 (eight) hours. 02/27/21   Rushie Chestnut, PA-C  fluticasone (FLONASE) 50 MCG/ACT nasal spray Place 2 sprays into both nostrils daily. 02/27/21   Rushie Chestnut, PA-C  metroNIDAZOLE  (FLAGYL) 500 MG tablet Take 1 tablet (500 mg total) by mouth 2 (two) times daily. Avoid alcohol while taking this medication and for 2 days after 04/05/21   Rhys Martini, PA-C  salmeterol (SEREVENT DISKUS) 50 MCG/DOSE diskus inhaler Inhale 1 puff into the lungs 2 (two) times daily. 12/13/20   Valinda Hoar, NP  tamsulosin (FLOMAX) 0.4 MG CAPS capsule Take 1 capsule (0.4 mg total) by mouth daily after supper. 02/27/21   Rushie Chestnut, PA-C  UNKNOWN TO PATIENT BCPs    [provider]      Allergies    Patient has no known allergies.    Review of Systems   Review of Systems  Physical Exam Updated Vital Signs BP (!) 115/91    Pulse 90    Temp 98.2 F (36.8 C) (Oral)    Resp 12    Ht 5\' 1"  (1.549 m)    Wt 61.2 kg    SpO2 98%    BMI 25.51 kg/m  Physical Exam Constitutional:      General: She is not in acute distress. HENT:     Head: Normocephalic and atraumatic.  Eyes:     Conjunctiva/sclera: Conjunctivae normal.     Pupils: Pupils are equal, round, and reactive to light.  Cardiovascular:     Rate and Rhythm: Normal rate and regular rhythm.  Pulmonary:     Effort: Pulmonary effort is normal. No respiratory  distress.  Abdominal:     General: There is no distension.     Tenderness: There is no abdominal tenderness. There is no right CVA tenderness, left CVA tenderness or guarding.  Skin:    General: Skin is warm and dry.  Neurological:     General: No focal deficit present.     Mental Status: She is alert. Mental status is at baseline.  Psychiatric:        Mood and Affect: Mood normal.        Behavior: Behavior normal.    ED Results / Procedures / Treatments   Labs (all labs ordered are listed, but only abnormal results are displayed) Labs Reviewed  URINALYSIS, ROUTINE W REFLEX MICROSCOPIC - Abnormal; Notable for the following components:      Result Value   Hgb urine dipstick MODERATE (*)    Ketones, ur TRACE (*)    Protein, ur TRACE (*)    Leukocytes,Ua  SMALL (*)    RBC / HPF >50 (*)    Bacteria, UA FEW (*)    All other components within normal limits  PREGNANCY, URINE    EKG None  Radiology CT Renal Stone Study  Result Date: 08/23/2021 CLINICAL DATA:  Right flank pain for 3 days. EXAM: CT ABDOMEN AND PELVIS WITHOUT CONTRAST TECHNIQUE: Multidetector CT imaging of the abdomen and pelvis was performed following the standard protocol without IV contrast. RADIATION DOSE REDUCTION: This exam was performed according to the departmental dose-optimization program which includes automated exposure control, adjustment of the mA and/or kV according to patient size and/or use of iterative reconstruction technique. COMPARISON:  None. FINDINGS: Lower chest: The lung bases are clear of acute process. No pleural effusion or pulmonary lesions. The heart is normal in size. No pericardial effusion. The distal esophagus and aorta are unremarkable. Hepatobiliary: No hepatic lesions or intrahepatic biliary dilatation. The gallbladder is unremarkable. No common bile duct dilatation. Pancreas: No mass, inflammation or ductal dilatation. Spleen: Normal size.  No focal lesions. Adrenals/Urinary Tract: The adrenal glands are normal. Bilateral renal calculi. Mild right-sided hydronephrosis and proximal right hydroureter due to a 3 mm calculus in the mid to upper ureter located at the L4-5 disc space level. No distal ureteral calculi or bladder calculi. Stomach/Bowel: The stomach, duodenum, small bowel and colon are grossly normal without oral contrast. No inflammatory changes, mass lesions or obstructive findings. The appendix is normal. Vascular/Lymphatic: The aorta is normal in caliber. No atheroscerlotic calcifications. No mesenteric of retroperitoneal mass or adenopathy. Small scattered lymph nodes are noted. Reproductive: The uterus and ovaries are unremarkable. Other: No pelvic mass or adenopathy. No free pelvic fluid collections. No inguinal mass or adenopathy. No  abdominal wall hernia or subcutaneous lesions. Musculoskeletal: No significant bony findings. IMPRESSION: 1. 3 mm mid right ureteral calculus at the L4-5 level causing mild right-sided hydronephrosis and proximal right hydroureter. 2. Bilateral renal calculi. 3. No other significant abdominal/pelvic findings and no mass lesions or adenopathy. Electronically Signed   By: Rudie Meyer M.D.   On: 08/23/2021 10:15    Procedures Procedures    Medications Ordered in ED Medications  oxyCODONE-acetaminophen (PERCOCET/ROXICET) 5-325 MG per tablet 1 tablet (1 tablet Oral Given 08/23/21 0952)  ibuprofen (ADVIL) tablet 800 mg (800 mg Oral Given 08/23/21 0952)  ondansetron (ZOFRAN-ODT) disintegrating tablet 4 mg (4 mg Oral Given 08/23/21 7741)    ED Course/ Medical Decision Making/ A&P Clinical Course as of 08/23/21 1037  Tue Aug 23, 2021  1018 IMPRESSION: 1. 3 mm  mid right ureteral calculus at the L4-5 level causing mild right-sided hydronephrosis and proximal right hydroureter. 2. Bilateral renal calculi. 3. No other significant abdominal/pelvic findings and no mass lesions or adenopathy. [MT]  1033 Pain significantly improved and medications.  Discussed CT findings with patient, 3 mm stone is likely to pass on its own.  No evidence of infection otherwise.  We discussed more oral hydration as she does have some concentration in her urine.  Medications will be prescribed to pharmacy.  Okay for discharge [MT]    Clinical Course User Index [MT] Yalanda Soderman, Kermit Balo, MD                           Medical Decision Making Amount and/or Complexity of Data Reviewed Labs: ordered. Radiology: ordered.  Risk OTC drugs. Prescription drug management.   Patient is here with colicky right-sided flank pain.  This most likely ureteral colic.  Differential would also include UTI   This is less likely appendicitis or acute biliary disease based on her abdominal exam.  UA does show protein, signs of blood.  No  evidence of infection otherwise.  Pregnancy test negative.  CT renal study ordered and personally reviewed and interpreted showing 3 mm right-sided stone, mid-ureter  Oral medications ordered, Percocet and Motrin and Zofran for pain and nausea.  On reassessment the patient's symptoms had significantly improved        Final Clinical Impression(s) / ED Diagnoses Final diagnoses:  Ureteral colic  Kidney stone    Rx / DC Orders ED Discharge Orders          Ordered    oxyCODONE-acetaminophen (PERCOCET/ROXICET) 5-325 MG tablet  Every 8 hours PRN        08/23/21 1036    ibuprofen (ADVIL) 800 MG tablet  Every 8 hours PRN        08/23/21 1036    tamsulosin (FLOMAX) 0.4 MG CAPS capsule  Daily after breakfast        08/23/21 1036    ondansetron (ZOFRAN-ODT) 4 MG disintegrating tablet  Every 8 hours PRN        08/23/21 1036    docusate sodium (COLACE) 100 MG capsule  Daily PRN        08/23/21 1036              Terald Sleeper, MD 08/23/21 1037

## 2021-08-29 ENCOUNTER — Other Ambulatory Visit: Payer: Self-pay

## 2021-08-29 ENCOUNTER — Emergency Department (HOSPITAL_BASED_OUTPATIENT_CLINIC_OR_DEPARTMENT_OTHER): Payer: Medicaid Other

## 2021-08-29 ENCOUNTER — Encounter (HOSPITAL_BASED_OUTPATIENT_CLINIC_OR_DEPARTMENT_OTHER): Payer: Self-pay | Admitting: *Deleted

## 2021-08-29 DIAGNOSIS — Z87442 Personal history of urinary calculi: Secondary | ICD-10-CM | POA: Insufficient documentation

## 2021-08-29 DIAGNOSIS — R1031 Right lower quadrant pain: Secondary | ICD-10-CM | POA: Insufficient documentation

## 2021-08-29 LAB — URINALYSIS, ROUTINE W REFLEX MICROSCOPIC
Bilirubin Urine: NEGATIVE
Glucose, UA: NEGATIVE mg/dL
Ketones, ur: NEGATIVE mg/dL
Leukocytes,Ua: NEGATIVE
Nitrite: NEGATIVE
Protein, ur: NEGATIVE mg/dL
Specific Gravity, Urine: 1.018 (ref 1.005–1.030)
pH: 7 (ref 5.0–8.0)

## 2021-08-29 LAB — PREGNANCY, URINE: Preg Test, Ur: NEGATIVE

## 2021-08-29 NOTE — ED Triage Notes (Signed)
Pt is here with right flank pain which has been increasing since yesterday.  Pt states that she feels like it is another kidney stone ?

## 2021-08-30 ENCOUNTER — Emergency Department (HOSPITAL_BASED_OUTPATIENT_CLINIC_OR_DEPARTMENT_OTHER)
Admission: EM | Admit: 2021-08-30 | Discharge: 2021-08-30 | Disposition: A | Discharge status: Home / Self Care | Payer: Medicaid Other | Attending: Emergency Medicine | Admitting: Emergency Medicine

## 2021-08-30 DIAGNOSIS — R109 Unspecified abdominal pain: Secondary | ICD-10-CM

## 2021-08-30 DIAGNOSIS — B9689 Other specified bacterial agents as the cause of diseases classified elsewhere: Secondary | ICD-10-CM

## 2021-08-30 HISTORY — DX: Calculus of kidney: N20.0

## 2021-08-30 LAB — WET PREP, GENITAL
Sperm: NONE SEEN
Trich, Wet Prep: NONE SEEN
WBC, Wet Prep HPF POC: <10 (ref <10)
Yeast Wet Prep HPF POC: NONE SEEN

## 2021-08-30 MED ORDER — METRONIDAZOLE 500 MG PO TABS: 500.0000 mg | ORAL_TABLET | Freq: Two times a day (BID) | ORAL | 0 refills | Status: DC

## 2021-08-30 NOTE — ED Provider Notes (Signed)
?[facility] EM[facility]GENC[facility] DEPT ?Provider Note ? ? ?CSN: [id number] ?Arrival date & time: 08/29/21  1944 ? ?  ? ?History ? ?Chief Complaint  ?Patient presents with  ? Flank Pain  ? ? ?Katherine Bailey is a 26 y.o. female. ? ?Patient is a 26 year old female with past medical history of renal calculi.  She presents today with complaints of right-sided flank pain.  She was seen here 1 week ago with similar complaints and diagnosed with a right UVJ stone.  She reports passing this stone at home several days ago, but pain returned today.  She denies any fevers or chills.  She denies any blood in her urine.  She reports the pain feels exactly like what she feels with kidney stones. ? ?Patient also concerned she may have bacterial vaginosis that is causing her pain.  She apparently has had pain like this with BV in the past.  She also reports a new sexual partner with sex protected, and sometimes not. ? ?The history is provided by the patient.  ? ?  ? ?Home Medications ?Prior to Admission medications   ?Medication Sig Start Date End Date Taking? Authorizing Provider  ?benzonatate (TESSALON) 100 MG capsule Take 1 capsule (100 mg total) by mouth every 8 (eight) hours. 02/27/21   Rushie Chestnut, PA-C  ?docusate sodium (COLACE) 100 MG capsule Take 1 capsule (100 mg total) by mouth daily as needed for up to 30 doses for mild constipation. 08/23/21   Terald Sleeper, MD  ?fluticasone (FLONASE) 50 MCG/ACT nasal spray Place 2 sprays into both nostrils daily. 02/27/21   Rushie Chestnut, PA-C  ?ibuprofen (ADVIL) 800 MG tablet Take 1 tablet (800 mg total) by mouth every 8 (eight) hours as needed for up to 30 doses. 08/23/21   Terald Sleeper, MD  ?metroNIDAZOLE (FLAGYL) 500 MG tablet Take 1 tablet (500 mg total) by mouth 2 (two) times daily. Avoid alcohol while taking this medication and for 2 days after 04/05/21   Rhys Martini, PA-C  ?ondansetron (ZOFRAN-ODT) 4 MG disintegrating tablet Take 1 tablet (4 mg total) by mouth  every 8 (eight) hours as needed for up to 15 doses for nausea or vomiting. 08/23/21   Terald Sleeper, MD  ?oxyCODONE-acetaminophen (PERCOCET/ROXICET) 5-325 MG tablet Take 1 tablet by mouth every 8 (eight) hours as needed for up to 10 doses for severe pain. 08/23/21   Terald Sleeper, MD  ?salmeterol (SEREVENT DISKUS) 50 MCG/DOSE diskus inhaler Inhale 1 puff into the lungs 2 (two) times daily. 12/13/20   Valinda Hoar, NP  ?tamsulosin (FLOMAX) 0.4 MG CAPS capsule Take 1 capsule (0.4 mg total) by mouth daily after supper. 02/27/21   Rushie Chestnut, PA-C  ?tamsulosin (FLOMAX) 0.4 MG CAPS capsule Take 1 capsule (0.4 mg total) by mouth daily after breakfast for 15 doses. Until you pass your stone 08/23/21 09/07/21  Trifan, Kermit Balo, MD  ?Crissie Figures TO PATIENT BCPs    [provider]  ?   ? ?Allergies    ?Patient has no known allergies.   ? ?Review of Systems   ?Review of Systems  ?All other systems reviewed and are negative. ? ?Physical Exam ?Updated Vital Signs ?BP 127/90   Pulse 89   Temp 99.7 ?F (37.6 ?C) (Oral)   Resp 18   SpO2 100%  ?Physical Exam ?Vitals and nursing note reviewed.  ?Constitutional:   ?   General: She is not in acute distress. ?   Appearance: She is well-developed.  She is not diaphoretic.  ?HENT:  ?   Head: Normocephalic and atraumatic.  ?Cardiovascular:  ?   Rate and Rhythm: Normal rate and regular rhythm.  ?   Heart sounds: No murmur heard. ?  No friction rub. No gallop.  ?Pulmonary:  ?   Effort: Pulmonary effort is normal. No respiratory distress.  ?   Breath sounds: Normal breath sounds. No wheezing.  ?Abdominal:  ?   General: Bowel sounds are normal. There is no distension.  ?   Palpations: Abdomen is soft.  ?   Tenderness: There is abdominal tenderness. There is right CVA tenderness. There is no guarding or rebound.  ?   Comments: There is tenderness to palpation in the right flank and right lower abdomen  ?Musculoskeletal:     ?   General: Normal range of motion.  ?    Cervical back: Normal range of motion and neck supple.  ?Skin: ?   General: Skin is warm and dry.  ?Neurological:  ?   General: No focal deficit present.  ?   Mental Status: She is alert and oriented to person, place, and time.  ? ? ?ED Results / Procedures / Treatments   ?Labs ?(all labs ordered are listed, but only abnormal results are displayed) ?Labs Reviewed  ?URINALYSIS, ROUTINE W REFLEX MICROSCOPIC - Abnormal; Notable for the following components:  ?    Result Value  ? Hgb urine dipstick SMALL (*)   ? All other components within normal limits  ?PREGNANCY, URINE  ?GC/CHLAMYDIA PROBE AMP ([place]) NOT AT [facility]  ? ? ?EKG ?None ? ?Radiology ?CT Renal Stone Study ? ?Result Date: 08/29/2021 ?CLINICAL DATA:  Right flank pain. EXAM: CT ABDOMEN AND PELVIS WITHOUT CONTRAST TECHNIQUE: Multidetector CT imaging of the abdomen and pelvis was performed following the standard protocol without IV contrast. RADIATION DOSE REDUCTION: This exam was performed according to the departmental dose-optimization program which includes automated exposure control, adjustment of the mA and/or kV according to patient size and/or use of iterative reconstruction technique. COMPARISON:  August 23, 2021 FINDINGS: Lower chest: No acute abnormality. Hepatobiliary: No focal liver abnormality is seen. No gallstones, gallbladder wall thickening, or biliary dilatation. Pancreas: Unremarkable. No pancreatic ductal dilatation or surrounding inflammatory changes. Spleen: Normal in size without focal abnormality. Adrenals/Urinary Tract: Adrenal glands are unremarkable. Kidneys are normal, without obstructing renal calculi, focal lesion, or hydronephrosis. Multiple bilateral 2 mm and 3 mm nonobstructing renal calculi are noted. The right ureteral calculus seen on the prior study is no longer present. Bladder is unremarkable. Stomach/Bowel: Stomach is within normal limits. Appendix appears normal. No evidence of bowel wall thickening, distention, or  inflammatory changes. Vascular/Lymphatic: No significant vascular findings are present. No enlarged abdominal or pelvic lymph nodes. Reproductive: Uterus and bilateral adnexa are unremarkable. Other: No abdominal wall hernia or abnormality. No abdominopelvic ascites. Musculoskeletal: No acute or significant osseous findings. IMPRESSION: Multiple bilateral subcentimeter nonobstructing renal calculi. Electronically Signed   By: Aram Candela M.D.   On: 08/29/2021 22:16   ? ?Procedures ?Procedures  ? ? ?Medications Ordered in ED ?Medications - No data to display ? ?ED Course/ Medical Decision Making/ A&P ? ?Patient presenting here with complaints of flank pain.  She was diagnosed 1 week ago with a renal calculus, but pain has returned and feels similar.  On exam, patient is tender to the right lower quadrant.  Work-up shows no significant abnormality on her CT scan.  She does have residual stones within the renal parenchyma, but  no obstructing stones.  Urinalysis shows small hemoglobin, but no infection. ? ?Patient requesting STD and BV testing.  This was performed.  She does have clue cells and a slight whitish discharge on exam.  She tells me that this has caused her pain in the past.  Patient to be treated with Flagyl and follow-up as needed. ? ?Final Clinical Impression(s) / ED Diagnoses ?Final diagnoses:  ?None  ? ? ?Rx / DC Orders ?ED Discharge Orders   ? ? None  ? ?  ? ? ?  ?Geoffery Lyons, MD ?08/30/21 0321 ? ?

## 2021-08-30 NOTE — Discharge Instructions (Signed)
Begin taking Flagyl as prescribed. ? ?Continue pain medications as previously prescribed as needed. ? ?We will call you if your cultures indicate you require further treatment or need to take additional action. ? ?Return to the ER if symptoms significantly worsen or change. ?

## 2021-08-31 LAB — GC/CHLAMYDIA PROBE AMP (~~LOC~~) NOT AT ARMC
Chlamydia: NEGATIVE
Comment: NEGATIVE
Comment: NORMAL
Neisseria Gonorrhea: NEGATIVE

## 2021-09-08 ENCOUNTER — Other Ambulatory Visit: Payer: Self-pay

## 2021-09-08 ENCOUNTER — Encounter (HOSPITAL_BASED_OUTPATIENT_CLINIC_OR_DEPARTMENT_OTHER): Payer: Self-pay | Admitting: Emergency Medicine

## 2021-09-08 ENCOUNTER — Emergency Department (HOSPITAL_BASED_OUTPATIENT_CLINIC_OR_DEPARTMENT_OTHER)
Admission: EM | Admit: 2021-09-08 | Discharge: 2021-09-08 | Disposition: A | Discharge status: Home / Self Care | Payer: Medicaid Other | Attending: Emergency Medicine | Admitting: Emergency Medicine

## 2021-09-08 DIAGNOSIS — L02413 Cutaneous abscess of right upper limb: Secondary | ICD-10-CM | POA: Insufficient documentation

## 2021-09-08 MED ORDER — LIDOCAINE-EPINEPHRINE (PF) 2 %-1:200000 IJ SOLN
10.0000 mL | Freq: Once | INTRAMUSCULAR | Status: AC
  Administered 2021-09-08: 10 mL
  Filled 2021-09-08: qty 20

## 2021-09-08 MED ORDER — CEPHALEXIN 500 MG PO CAPS: 500.0000 mg | ORAL_CAPSULE | Freq: Four times a day (QID) | ORAL | 0 refills | Status: AC

## 2021-09-08 NOTE — ED Triage Notes (Signed)
On Monday pt reports a discovering a large bump on her right shoulder shoulder. Pt had never noticed it before. Pain with exertion.  ?

## 2021-09-08 NOTE — Discharge Instructions (Signed)
Like we discussed, I prescribed you an antibiotic called Keflex.  Please take this 4 times per day for the next 7 days.  Do not stop taking this early.  You can continue to apply warm compresses to the site.  Please keep it clean and dry. ? ?If you develop worsening pain, redness, discharge from the wound that is not resolving, red streaking, fevers, vomiting, or just generally worsening symptoms, please come back to the emergency department immediately for reevaluation. ?

## 2021-09-08 NOTE — ED Provider Notes (Signed)
?[facility]PT ?Provider Note ? ? ?CSN: [id number] ?Arrival date & time: 09/08/21  1730 ? ?  ? ?History ? ?Chief Complaint  ?Patient presents with  ? Shoulder Pain  ? ? ?Katherine Bailey is a 26 y.o. female. ? ?HPI ?Patient is a 26 year old female who presents to the emergency department due to a painful lump to the right superior shoulder.  She states that in the past she has had "small pimples" in the region that will spontaneously drain and resolve.  She states that about 2 days ago she began noticing a larger painful lump that initially was draining a foul-smelling fluid but this stopped and it once again began swelling and became painful.  Denies any tattoos in the region.  Denies history of IVDA.  Denies any fevers, chills, nausea, vomiting. ?  ? ?Home Medications ?Prior to Admission medications   ?Medication Sig Start Date End Date Taking? Authorizing Provider  ?cephALEXin (KEFLEX) 500 MG capsule Take 1 capsule (500 mg total) by mouth 4 (four) times daily for 7 days. 09/08/21 09/15/21 Yes Placido SouJoldersma, Lavene Penagos, PA-C  ?benzonatate (TESSALON) 100 MG capsule Take 1 capsule (100 mg total) by mouth every 8 (eight) hours. 02/27/21   Rushie Chestnutovington, Sarah M, PA-C  ?docusate sodium (COLACE) 100 MG capsule Take 1 capsule (100 mg total) by mouth daily as needed for up to 30 doses for mild constipation. 08/23/21   Terald Sleeperrifan, Matthew J, MD  ?fluticasone (FLONASE) 50 MCG/ACT nasal spray Place 2 sprays into both nostrils daily. 02/27/21   Rushie Chestnutovington, Sarah M, PA-C  ?ibuprofen (ADVIL) 800 MG tablet Take 1 tablet (800 mg total) by mouth every 8 (eight) hours as needed for up to 30 doses. 08/23/21   Terald Sleeperrifan, Matthew J, MD  ?metroNIDAZOLE (FLAGYL) 500 MG tablet Take 1 tablet (500 mg total) by mouth 2 (two) times daily. One po bid x 7 days 08/30/21   Geoffery Lyonselo, Douglas, MD  ?ondansetron (ZOFRAN-ODT) 4 MG disintegrating tablet Take 1 tablet (4 mg total) by mouth every 8 (eight) hours as needed for up to 15 doses for nausea or vomiting.  08/23/21   Terald Sleeperrifan, Matthew J, MD  ?oxyCODONE-acetaminophen (PERCOCET/ROXICET) 5-325 MG tablet Take 1 tablet by mouth every 8 (eight) hours as needed for up to 10 doses for severe pain. 08/23/21   Terald Sleeperrifan, Matthew J, MD  ?salmeterol (SEREVENT DISKUS) 50 MCG/DOSE diskus inhaler Inhale 1 puff into the lungs 2 (two) times daily. 12/13/20   Valinda HoarWhite, Adrienne R, NP  ?tamsulosin (FLOMAX) 0.4 MG CAPS capsule Take 1 capsule (0.4 mg total) by mouth daily after supper. 02/27/21   Rushie Chestnutovington, Sarah M, PA-C  ?UNKNOWN TO PATIENT BCPs    [provider]  ?   ? ?Allergies    ?Patient has no known allergies.   ? ?Review of Systems   ?Review of Systems  ?Constitutional:  Negative for chills and fever.  ?Gastrointestinal:  Negative for nausea and vomiting.  ?Musculoskeletal:  Positive for myalgias.  ?Skin:  Positive for color change and rash.  ?Neurological:  Negative for numbness.  ? ?Physical Exam ?Updated Vital Signs ?BP 126/76   Pulse 88   Temp 98.2 ?F (36.8 ?C) (Oral)   Resp 18   Ht 5\' 1"  (1.549 m)   Wt 59 kg   SpO2 100%   BMI 24.56 kg/m?  ?Physical Exam ?Vitals and nursing note reviewed.  ?Constitutional:   ?   General: She is not in acute distress. ?   Appearance: She is well-developed.  ?HENT:  ?  Head: Normocephalic and atraumatic.  ?   Right Ear: External ear normal.  ?   Left Ear: External ear normal.  ?Eyes:  ?   General: No scleral icterus.    ?   Right eye: No discharge.     ?   Left eye: No discharge.  ?   Conjunctiva/sclera: Conjunctivae normal.  ?Neck:  ?   Trachea: No tracheal deviation.  ?Cardiovascular:  ?   Rate and Rhythm: Normal rate.  ?Pulmonary:  ?   Effort: Pulmonary effort is normal. No respiratory distress.  ?   Breath sounds: No stridor.  ?Abdominal:  ?   General: There is no distension.  ?Musculoskeletal:     ?   General: No swelling or deformity.  ?   Cervical back: Neck supple.  ?Skin: ?   General: Skin is warm and dry.  ?   Findings: No rash.  ?   Comments: 3 cm elevated fluctuant pustule with  surrounding erythema noted to the right superior shoulder.  Consistent with abscess.  No active drainage noted on my exam.  No red streaking.  Full range of motion of the right shoulder.  ?Neurological:  ?   Mental Status: She is alert.  ?   Cranial Nerves: Cranial nerve deficit: no gross deficits.  ? ?ED Results / Procedures / Treatments   ?Labs ?(all labs ordered are listed, but only abnormal results are displayed) ?Labs Reviewed - No data to display ? ?EKG ?None ? ?Radiology ?No results found. ? ?Procedures ?Marland Kitchen.Incision and Drainage ? ?Date/Time: 09/09/2021 7:03 PM ?Performed by: Placido Sou, PA-C ?Authorized by: Placido Sou, PA-C  ? ?Consent:  ?  Consent obtained:  Verbal ?  Consent given by:  Patient ?  Risks, benefits, and alternatives were discussed: yes   ?  Risks discussed:  Bleeding, incomplete drainage, pain and infection ?Universal protocol:  ?  Procedure explained and questions answered to patient or proxy's satisfaction: yes   ?  Relevant documents present and verified: yes   ?  Test results available : yes   ?  Imaging studies available: yes   ?  Required blood products, implants, devices, and special equipment available: yes   ?  Site/side marked: yes   ?  Immediately prior to procedure, a time out was called: yes   ?  Patient identity confirmed:  Verbally with patient ?Location:  ?  Type:  Abscess ?  Size:  3 cm ?  Location: right shoulder. ?Pre-procedure details:  ?  Skin preparation:  Povidone-iodine ?Sedation:  ?  Sedation type:  None ?Anesthesia:  ?  Anesthesia method:  Local infiltration ?  Local anesthetic:  Lidocaine 2% WITH epi ?Procedure type:  ?  Complexity:  Complex ?Procedure details:  ?  Ultrasound guidance: no   ?  Needle aspiration: no   ?  Incision types:  Stab incision ?  Incision depth:  Dermal ?  Wound management:  Probed and deloculated and extensive cleaning ?  Drainage:  Purulent and bloody ?  Drainage amount:  Moderate ?  Wound treatment:  Wound left open ?  Packing  materials:  None ?Post-procedure details:  ?  Procedure completion:  Tolerated well, no immediate complications  ? ?Medications Ordered in ED ?Medications  ?lidocaine-EPINEPHrine (XYLOCAINE W/EPI) 2 %-1:200000 (PF) injection 10 mL (10 mLs Infiltration Given 09/08/21 2050)  ? ?ED Course/ Medical Decision Making/ A&P ?  ?                        ?  Medical Decision Making ?Risk ?Prescription drug management. ? ?Pt is a 26 y.o. female who presents to the emergency department due to an abscess to the right superior shoulder. ? ?Patient reports a history of abscesses in the region but typically they are smaller and will rupture and spontaneously resolve without intervention.  She has never had an I&D performed before.  About 2 days ago she began developing an abscess to the right shoulder that initially drained but closed and then began to worsen once again.  On my exam she has a 3 cm tender fluctuant region on the right superior shoulder.  Mild erythema in the region but no significant erythema.  No red streaking.  Full range of motion of the right shoulder.  Patient denies any systemic symptoms such as fevers, chills, nausea, or vomiting.  She is currently afebrile, nontachycardic, and nontoxic-appearing. ? ?Wound was cleaned and I&D was performed.  Resulted in a moderate mount of purulent discharge.  Wound left open. ? ?Will discharge on a course of Keflex.  Patient denies any known drug allergies.  Discussed return precautions at length.  Patient appears stable for discharge at this time and she is agreeable.  Her questions were answered and she was amicable at the time of discharge. ? ?Note: Portions of this report may have been transcribed using voice recognition software. Every effort was made to ensure accuracy; however, inadvertent computerized transcription errors may be present.  ? ?Final Clinical Impression(s) / ED Diagnoses ?Final diagnoses:  ?Abscess  ? ?Rx / DC Orders ?ED Discharge Orders   ? ?      Ordered  ?   cephALEXin (KEFLEX) 500 MG capsule  4 times daily       ? 09/08/21 2122  ? ?  ?  ? ?  ? ? ?  ?Placido Sou, PA-C ?09/09/21 1910 ? ?  ?Milagros Loll, MD ?09/10/21 1434 ? ?

## 2021-09-26 ENCOUNTER — Ambulatory Visit (HOSPITAL_BASED_OUTPATIENT_CLINIC_OR_DEPARTMENT_OTHER): Payer: Medicaid Other | Admitting: Nurse Practitioner

## 2021-10-06 ENCOUNTER — Encounter (HOSPITAL_BASED_OUTPATIENT_CLINIC_OR_DEPARTMENT_OTHER): Payer: Self-pay | Admitting: Nurse Practitioner

## 2022-03-27 ENCOUNTER — Ambulatory Visit (INDEPENDENT_AMBULATORY_CARE_PROVIDER_SITE_OTHER): Payer: Self-pay | Admitting: Family Medicine

## 2022-03-27 ENCOUNTER — Other Ambulatory Visit: Payer: Self-pay

## 2022-03-27 ENCOUNTER — Encounter: Payer: Self-pay | Admitting: Family Medicine

## 2022-03-27 VITALS — BP 114/80 | HR 99

## 2022-03-27 DIAGNOSIS — Z30011 Encounter for initial prescription of contraceptive pills: Secondary | ICD-10-CM

## 2022-03-27 DIAGNOSIS — Z01419 Encounter for gynecological examination (general) (routine) without abnormal findings: Secondary | ICD-10-CM

## 2022-03-27 DIAGNOSIS — J4530 Mild persistent asthma, uncomplicated: Secondary | ICD-10-CM

## 2022-03-27 DIAGNOSIS — J45909 Unspecified asthma, uncomplicated: Secondary | ICD-10-CM | POA: Insufficient documentation

## 2022-03-27 MED ORDER — NORETHIN ACE-ETH ESTRAD-FE 1-20 MG-MCG PO TABS: 1.0000 | ORAL_TABLET | Freq: Every day | ORAL | 4 refills | Status: DC

## 2022-03-27 NOTE — Progress Notes (Signed)
GYNECOLOGY ANNUAL PREVENTATIVE CARE ENCOUNTER NOTE  Subjective:   Katherine Bailey is a 26 y.o. No obstetric history on file. female here for a routine annual gynecologic exam.    PCP Maudie Flakes   Current complaints: Recurrent cyst on lower left groin.   Reports it gets inflamed and then pops. She reports it is currently not inflamed. She thinks it might be an ingrown hair.  Patient has recently lost insurance and is concerned about cost of pap and STI testing.   Denies abnormal vaginal bleeding, discharge, pelvic pain, problems with intercourse or other gynecologic concerns.    Gynecologic History Patient's last menstrual period was 02/08/2022 (exact date). Contraception: OCP (estrogen/progesterone) Last Pap: 2-3 years ago. Results were: normal Last mammogram: NA.   Health Maintenance Due  Topic Date Due   COVID-19 Vaccine (1) Never done   HPV VACCINES (1 - 2-dose series) Never done   HIV Screening  Never done   Hepatitis C Screening  Never done   TETANUS/TDAP  Never done   PAP-Cervical Cytology Screening  Never done   PAP SMEAR-Modifier  Never done   INFLUENZA VACCINE  Never done    The following portions of the patient's history were reviewed and updated as appropriate: allergies, current medications, past family history, past medical history, past social history, past surgical history and problem list.  Review of Systems Pertinent items are noted in HPI.   Objective:  BP 114/80   Pulse 99   LMP 02/08/2022 (Exact Date)  CONSTITUTIONAL: Well-developed, well-nourished female in no acute distress.  HENT:  Normocephalic, atraumatic, External right and left ear normal. Oropharynx is clear and moist EYES:  No scleral icterus.  NECK: Normal range of motion, supple, no masses.  Normal thyroid.  SKIN: Skin is warm and dry. No rash noted. Not diaphoretic. No erythema. No pallor. NEUROLOGIC: Alert and oriented to person, place, and time. Normal reflexes, muscle tone  coordination. No cranial nerve deficit noted. PSYCHIATRIC: Normal mood and affect. Normal behavior. Normal judgment and thought content. CARDIOVASCULAR: Normal heart rate noted, regular rhythm. 2+ distal pulses. RESPIRATORY: Effort and breath sounds normal, no problems with respiration noted. BREASTS: deferred ABDOMEN: Soft,  no distention noted.  No tenderness, rebound or guarding.  PELVIC: deferred MUSCULOSKELETAL: Normal range of motion.     Assessment and Plan:  1) Annual gynecologic examination: Shared decision making with the patient about cost of pap and STI screening. Patient prefers to wait until insurance status is resolved. Reviewed low cost options and financial aid application. Dicussed importance of screening in general.   Routine preventative health maintenance measures emphasized.  2) Contraception counseling: patient centered discussion. Likes OCP. Wants to continue. Patient desires OCP, this was prescribed for patient. She will follow up in  1 year for surveillance.  She was told to call with any further questions, or with any concerns about this method of contraception.  Emphasized use of condoms 100% of the time for STI prevention.  1. Mild persistent asthma without complication Continue medications  2. Well woman exam with routine gynecological exam Pap at next visit with routine STI screening  3. Encounter for initial prescription of contraceptive pills - norethindrone-ethinyl estradiol-FE (LOESTRIN FE) 1-20 MG-MCG tablet; Take 1 tablet by mouth daily.  Dispense: 84 tablet; Refill: 4   Please refer to After Visit Summary for other counseling recommendations.   Return in about 2 months (around 05/27/2022) for pap and STI testing.  Caren Macadam, MD, MPH, ABFM Attending [facility]

## 2022-05-01 ENCOUNTER — Ambulatory Visit
Admission: EM | Admit: 2022-05-01 | Discharge: 2022-05-01 | Disposition: A | Discharge status: Home / Self Care | Payer: Self-pay | Attending: Urgent Care | Admitting: Urgent Care

## 2022-05-01 DIAGNOSIS — Z8709 Personal history of other diseases of the respiratory system: Secondary | ICD-10-CM

## 2022-05-01 DIAGNOSIS — J988 Other specified respiratory disorders: Secondary | ICD-10-CM

## 2022-05-01 DIAGNOSIS — B9789 Other viral agents as the cause of diseases classified elsewhere: Secondary | ICD-10-CM

## 2022-05-01 MED ORDER — CETIRIZINE HCL 10 MG PO TABS: 10.0000 mg | ORAL_TABLET | Freq: Every day | ORAL | 0 refills | Status: DC

## 2022-05-01 MED ORDER — PREDNISONE 20 MG PO TABS: ORAL_TABLET | ORAL | 0 refills | Status: DC

## 2022-05-01 MED ORDER — PROMETHAZINE-DM 6.25-15 MG/5ML PO SYRP: 2.5000 mL | ORAL_SOLUTION | Freq: Three times a day (TID) | ORAL | 0 refills | Status: DC | PRN

## 2022-05-01 NOTE — ED Triage Notes (Signed)
Patient presents to UC for sore throat and cough since 1 week ago. Taking tylenol cold/flu and drinking hot tea.

## 2022-05-01 NOTE — ED Provider Notes (Signed)
[facility]  Note:  This document was prepared using Conservation officer, historic buildings and may include unintentional dictation errors.  MRN: [id number] DOB: [birth year]  Subjective:   Katherine Bailey is a 26 y.o. female presenting for 1 week history of persistent coughing, throat pain, sick symptoms.  Patient uses marijuana on occasion.  Has a history of asthma but never really took her medication to help with it.  Has not needed albuterol inhaler for a while.  Has been using Tylenol Cold and flu, drinking hot tea.  Takes oral birth control.  No Known Allergies  Past Medical History:  Diagnosis Date   Chlamydia    Kidney stones      History reviewed. No pertinent surgical history.  Family History  Problem Relation Age of Onset   Healthy Mother    Healthy Father     Social History   Tobacco Use   Smoking status: Never   Smokeless tobacco: Never  Vaping Use   Vaping Use: Never used  Substance Use Topics   Alcohol use: Yes    Comment: rarely   Drug use: Not Currently    Types: Marijuana    ROS   Objective:   Vitals: BP 104/69 (BP Location: Right Arm)   Pulse 69   Temp 98.7 F (37.1 C) (Oral)   Resp 16   SpO2 97%   Physical Exam Constitutional:      General: She is not in acute distress.    Appearance: Normal appearance. She is well-developed and normal weight. She is not ill-appearing, toxic-appearing or diaphoretic.  HENT:     Head: Normocephalic and atraumatic.     Right Ear: Tympanic membrane, ear canal and external ear normal. No drainage or tenderness. No middle ear effusion. There is no impacted cerumen. Tympanic membrane is not erythematous or bulging.     Left Ear: Tympanic membrane, ear canal and external ear normal. No drainage or tenderness.  No middle ear effusion. There is no impacted cerumen. Tympanic membrane is not erythematous or bulging.     Nose: Nose normal. No congestion or rhinorrhea.     Mouth/Throat:      Mouth: Mucous membranes are moist. No oral lesions.     Pharynx: No pharyngeal swelling, oropharyngeal exudate, posterior oropharyngeal erythema or uvula swelling.     Tonsils: No tonsillar exudate or tonsillar abscesses.  Eyes:     General: No scleral icterus.       Right eye: No discharge.        Left eye: No discharge.     Extraocular Movements: Extraocular movements intact.     Right eye: Normal extraocular motion.     Left eye: Normal extraocular motion.     Conjunctiva/sclera: Conjunctivae normal.  Cardiovascular:     Rate and Rhythm: Normal rate and regular rhythm.     Heart sounds: Normal heart sounds. No murmur heard.    No friction rub. No gallop.  Pulmonary:     Effort: Pulmonary effort is normal. No respiratory distress.     Breath sounds: No stridor. No wheezing, rhonchi or rales.  Chest:     Chest wall: No tenderness.  Musculoskeletal:     Cervical back: Normal range of motion and neck supple.  Lymphadenopathy:     Cervical: No cervical adenopathy.  Skin:    General: Skin is warm and dry.  Neurological:     General: No focal deficit present.     Mental Status: She is alert  and oriented to person, place, and time.  Psychiatric:        Mood and Affect: Mood normal.        Behavior: Behavior normal.      Assessment and Plan :   PDMP not reviewed this encounter.  1. Viral respiratory illness   2. History of asthma     Given her history of asthma and respiratory symptoms, recommended a oral prednisone course.  Otherwise use supportive care for an acute viral respiratory illness.    I did offer an antibiotic to address sinusitis should she continue to have symptoms this upcoming Friday.  She is also welcome to come in for recheck.  Deferred imaging given clear cardiopulmonary exam, hemodynamically stable vital signs.  Given timeline of illness deferred COVID testing. Counseled patient on potential for adverse effects with medications prescribed/recommended  today, ER and return-to-clinic precautions discussed, patient verbalized understanding.    Jaynee Eagles, [place] 05/01/22 1934

## 2022-05-23 NOTE — Progress Notes (Signed)
Pt canceled visit.

## 2022-05-24 ENCOUNTER — Ambulatory Visit: Payer: Self-pay | Admitting: Certified Nurse Midwife

## 2022-05-24 DIAGNOSIS — Z124 Encounter for screening for malignant neoplasm of cervix: Secondary | ICD-10-CM

## 2022-05-24 DIAGNOSIS — A64 Unspecified sexually transmitted disease: Secondary | ICD-10-CM

## 2022-05-24 DIAGNOSIS — Z01419 Encounter for gynecological examination (general) (routine) without abnormal findings: Secondary | ICD-10-CM

## 2022-07-11 ENCOUNTER — Other Ambulatory Visit (HOSPITAL_COMMUNITY)
Admission: RE | Admit: 2022-07-11 | Discharge: 2022-07-11 | Disposition: A | Discharge status: Home / Self Care | Payer: No Typology Code available for payment source | Source: Ambulatory Visit | Attending: Advanced Practice Midwife | Admitting: Advanced Practice Midwife

## 2022-07-11 ENCOUNTER — Ambulatory Visit (INDEPENDENT_AMBULATORY_CARE_PROVIDER_SITE_OTHER): Payer: Self-pay | Admitting: Advanced Practice Midwife

## 2022-07-11 ENCOUNTER — Encounter: Payer: Self-pay | Admitting: Advanced Practice Midwife

## 2022-07-11 VITALS — BP 110/64 | HR 77 | Ht 60.0 in | Wt 142.0 lb

## 2022-07-11 DIAGNOSIS — Z01419 Encounter for gynecological examination (general) (routine) without abnormal findings: Secondary | ICD-10-CM | POA: Diagnosis present

## 2022-07-11 NOTE — Progress Notes (Signed)
GYNECOLOGY ANNUAL PREVENTATIVE CARE ENCOUNTER NOTE  Subjective:   Katherine Bailey is a 27 y.o. G0P0000 female here for a routine annual gynecologic exam.  Current complaints: would like STD testing.   Denies abnormal vaginal bleeding, discharge, pelvic pain, problems with intercourse or other gynecologic concerns.    Gynecologic History Patient's last menstrual period was 06/22/2022 (exact date). Contraception: OCP (estrogen/progesterone) Last Pap: unsure. Results were: patient cannot recall ever having an abnormal pap. No results in Epic or care everywhere.    Obstetric History OB History  Gravida Para Term Preterm AB Living  0 0 0 0 0 0  SAB IAB Ectopic Multiple Live Births  0 0 0 0 0    Past Medical History:  Diagnosis Date   Chlamydia    Kidney stones     History reviewed. No pertinent surgical history.  Current Outpatient Medications on File Prior to Visit  Medication Sig Dispense Refill   cetirizine (ZYRTEC ALLERGY) 10 MG tablet Take 1 tablet (10 mg total) by mouth daily. 30 tablet 0   norethindrone-ethinyl estradiol-FE (LOESTRIN FE) 1-20 MG-MCG tablet Take 1 tablet by mouth daily. 84 tablet 4   No current facility-administered medications on file prior to visit.    No Known Allergies  Social History   Socioeconomic History   Marital status: Single    Spouse name: Not on file   Number of children: Not on file   Years of education: Not on file   Highest education level: Not on file  Occupational History   Not on file  Tobacco Use   Smoking status: Never   Smokeless tobacco: Never  Vaping Use   Vaping Use: Never used  Substance and Sexual Activity   Alcohol use: Yes    Comment: rarely   Drug use: Not Currently    Types: Marijuana   Sexual activity: Yes    Birth control/protection: Pill  Other Topics Concern   Not on file  Social History Narrative   Not on file   Social Determinants of Health   Financial Resource Strain: Not on file  Food Insecurity:  No Food Insecurity (07/11/2022)   Hunger Vital Sign    Worried About Running Out of Food in the Last Year: Never true    Ran Out of Food in the Last Year: Never true  Transportation Needs: No Transportation Needs (07/11/2022)   PRAPARE - Hydrologist (Medical): No    Lack of Transportation (Non-Medical): No  Physical Activity: Not on file  Stress: Not on file  Social Connections: Not on file  Intimate Partner Violence: Not on file    Family History  Problem Relation Age of Onset   Healthy Mother    Healthy Father     The following portions of the patient's history were reviewed and updated as appropriate: allergies, current medications, past family history, past medical history, past social history, past surgical history and problem list.  Review of Systems Pertinent items noted in HPI and remainder of comprehensive ROS otherwise negative.   Objective:  BP 110/64   Pulse 77   Ht 5' (1.524 m)   Wt 142 lb (64.4 kg)   LMP 06/22/2022 (Exact Date)   BMI 27.73 kg/m  CONSTITUTIONAL: Well-developed, well-nourished female in no acute distress.  HENT:  Normocephalic, atraumatic, External right and left ear normal. Oropharynx is clear and moist EYES: Conjunctivae and EOM are normal. Pupils are equal, round, and reactive to light. No scleral icterus.  NECK: Normal  range of motion, supple, no masses.  Normal thyroid.  SKIN: Skin is warm and dry. No rash noted. Not diaphoretic. No erythema. No pallor. NEUROLOGIC: Alert and oriented to person, place, and time. Normal reflexes, muscle tone coordination. No cranial nerve deficit noted. PSYCHIATRIC: Normal mood and affect. Normal behavior. Normal judgment and thought content. CARDIOVASCULAR: Normal heart rate noted, regular rhythm RESPIRATORY: Clear to auscultation bilaterally. Effort and breath sounds normal, no problems with respiration noted. BREASTS: Symmetric in size. No masses, skin changes, nipple drainage, or  lymphadenopathy. Nipples inverted ABDOMEN: Soft, normal bowel sounds, no distention noted.  No tenderness, rebound or guarding.  PELVIC: Normal appearing external genitalia; normal appearing vaginal mucosa and cervix.  No abnormal discharge noted.  Pap smear obtained.  Normal uterine size, no other palpable masses, no uterine or adnexal tenderness. MUSCULOSKELETAL: Normal range of motion. No tenderness.  No cyanosis, clubbing, or edema.  2+ distal pulses.   Assessment and Plan:  1. Well woman exam with routine gynecological exam - Cytology - PAP( [place]) - HIV Antibody (routine testing w rflx) - RPR - Hepatitis B Surface AntiGEN - Hepatitis C Antibody  Patient has refills on file at [facility]. Advised to call for refill, if there are any issues she can send [place] a message and we can send in a new RX.   Will follow up results of pap smear and manage accordingly. Routine preventative health maintenance measures emphasized. Please refer to After Visit Summary for other counseling recommendations.    Marcille Buffy DNP, CNM  07/11/22  8:54 AM

## 2022-07-12 LAB — HEPATITIS B SURFACE ANTIGEN: Hepatitis B Surface Ag: NEGATIVE

## 2022-07-12 LAB — HEPATITIS C ANTIBODY: Hep C Virus Ab: NONREACTIVE

## 2022-07-12 LAB — HIV ANTIBODY (ROUTINE TESTING W REFLEX): HIV Screen 4th Generation wRfx: NONREACTIVE

## 2022-07-12 LAB — RPR: RPR Ser Ql: NONREACTIVE

## 2022-07-13 LAB — CYTOLOGY - PAP
Chlamydia: NEGATIVE
Comment: NEGATIVE
Comment: NEGATIVE
Comment: NORMAL
Diagnosis: NEGATIVE
Neisseria Gonorrhea: NEGATIVE
Trichomonas: NEGATIVE

## 2022-09-06 ENCOUNTER — Ambulatory Visit
Admission: EM | Admit: 2022-09-06 | Discharge: 2022-09-06 | Disposition: A | Discharge status: Home / Self Care | Payer: No Typology Code available for payment source | Attending: Nurse Practitioner | Admitting: Nurse Practitioner

## 2022-09-06 DIAGNOSIS — J029 Acute pharyngitis, unspecified: Secondary | ICD-10-CM | POA: Insufficient documentation

## 2022-09-06 DIAGNOSIS — Z1152 Encounter for screening for COVID-19: Secondary | ICD-10-CM | POA: Insufficient documentation

## 2022-09-06 DIAGNOSIS — J028 Acute pharyngitis due to other specified organisms: Secondary | ICD-10-CM | POA: Insufficient documentation

## 2022-09-06 DIAGNOSIS — B9789 Other viral agents as the cause of diseases classified elsewhere: Secondary | ICD-10-CM | POA: Diagnosis not present

## 2022-09-06 DIAGNOSIS — Z793 Long term (current) use of hormonal contraceptives: Secondary | ICD-10-CM | POA: Insufficient documentation

## 2022-09-06 LAB — POCT RAPID STREP A (OFFICE): Rapid Strep A Screen: NEGATIVE

## 2022-09-06 NOTE — ED Provider Notes (Signed)
[facility]    CSN: [id number] Arrival date & time: 09/06/22  1812      History   Chief Complaint Chief Complaint  Patient presents with   Sore Throat    HPI Katherine Bailey is a 27 y.o. female  presents for evaluation of URI symptoms for 2 days. Patient reports associated symptoms of sore throat mild cough. Denies N/V/D, fevers, congestion, ear pain, body aches, shortness of breath. Patient does not have a hx of asthma or smoking.  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  Pt has taken throat lozenges OTC for symptoms. Pt has no other concerns at this time.    Sore Throat    Past Medical History:  Diagnosis Date   Chlamydia    Kidney stones     Patient Active Problem List   Diagnosis Date Noted   Asthma 03/27/2022    History reviewed. No pertinent surgical history.  OB History     Gravida  0   Para  0   Term  0   Preterm  0   AB  0   Living  0      SAB  0   IAB  0   Ectopic  0   Multiple  0   Live Births  0            Home Medications    Prior to Admission medications   Medication Sig Start Date End Date Taking? Authorizing Provider  cetirizine (ZYRTEC ALLERGY) 10 MG tablet Take 1 tablet (10 mg total) by mouth daily. 05/01/22   Jaynee Eagles, PA-C  norethindrone-ethinyl estradiol-FE (LOESTRIN FE) 1-20 MG-MCG tablet Take 1 tablet by mouth daily. 03/27/22   Caren Macadam, MD    Family History Family History  Problem Relation Age of Onset   Healthy Mother    Healthy Father     Social History Social History   Tobacco Use   Smoking status: Never   Smokeless tobacco: Never  Vaping Use   Vaping Use: Never used  Substance Use Topics   Alcohol use: Yes    Comment: rarely   Drug use: Not Currently    Types: Marijuana     Allergies   Patient has no known allergies.   Review of Systems Review of Systems  HENT:  Positive for sore throat.   Respiratory:  Positive for cough.      Physical  Exam Triage Vital Signs ED Triage Vitals [09/06/22 1821]  Enc Vitals Group     BP 130/89     Pulse Rate 84     Resp 18     Temp 99 F (37.2 C)     Temp Source Oral     SpO2 97 %     Weight      Height      Head Circumference      Peak Flow      Pain Score 4     Pain Loc      Pain Edu?      Excl. in [place]?    No data found.  Updated Vital Signs BP 130/89 (BP Location: Right Arm)   Pulse 84   Temp 99 F (37.2 C) (Oral)   Resp 18   SpO2 97%   Visual Acuity Right Eye Distance:   Left Eye Distance:   Bilateral Distance:    Right Eye Near:   Left Eye Near:    Bilateral Near:  Physical Exam Vitals and nursing note reviewed.  Constitutional:      General: She is not in acute distress.    Appearance: She is well-developed. She is not ill-appearing.  HENT:     Head: Normocephalic and atraumatic.     Right Ear: Tympanic membrane and ear canal normal.     Left Ear: Tympanic membrane and ear canal normal.     Nose: No congestion or rhinorrhea.     Mouth/Throat:     Mouth: Mucous membranes are moist.     Pharynx: Oropharynx is clear. Uvula midline. Posterior oropharyngeal erythema present.     Tonsils: No tonsillar exudate or tonsillar abscesses.  Eyes:     Conjunctiva/sclera: Conjunctivae normal.     Pupils: Pupils are equal, round, and reactive to light.  Cardiovascular:     Rate and Rhythm: Normal rate and regular rhythm.     Heart sounds: Normal heart sounds.  Pulmonary:     Effort: Pulmonary effort is normal.     Breath sounds: Normal breath sounds.  Musculoskeletal:     Cervical back: Normal range of motion and neck supple.  Lymphadenopathy:     Cervical: No cervical adenopathy.  Skin:    General: Skin is warm and dry.  Neurological:     General: No focal deficit present.     Mental Status: She is alert and oriented to person, place, and time.  Psychiatric:        Mood and Affect: Mood normal.        Behavior: Behavior normal.      UC Treatments /  Results  Labs (all labs ordered are listed, but only abnormal results are displayed) Labs Reviewed  SARS CORONAVIRUS 2 (TAT 6-24 HRS)  CULTURE, GROUP A STREP [facility])  POCT RAPID STREP A (OFFICE)    EKG   Radiology No results found.  Procedures Procedures (including critical care time)  Medications Ordered in UC Medications - No data to display  Initial Impression / Assessment and Plan / UC Course  I have reviewed the triage vital signs and the nursing notes.  Pertinent labs & imaging results that were available during my care of the patient were reviewed by me and considered in my medical decision making (see chart for details).     Negative rapid strep, will culture given close exposure COVID PCR and will contact if positive Continue OTC analgesics and lozenges as needed Salt water gargles warm liquids Follow-up with PCP if symptoms do not improve ER precautions reviewed and patient verbalized understanding Final Clinical Impressions(s) / UC Diagnoses   Final diagnoses:  Sore throat  Viral pharyngitis     Discharge Instructions      Continue over-the-counter throat lozenges and salt water gargles and warm liquids The clinical contact you if the result of your COVID test or throat culture are positive Follow-up with your PCP if symptoms do not improve Please go to the ER for any worsening symptoms     ED Prescriptions   None    PDMP not reviewed this encounter.   Melynda Ripple, NP 09/06/22 [phone]

## 2022-09-06 NOTE — Discharge Instructions (Addendum)
Continue over-the-counter throat lozenges and salt water gargles and warm liquids The clinical contact you if the result of your COVID test or throat culture are positive Follow-up with your PCP if symptoms do not improve Please go to the ER for any worsening symptoms

## 2022-09-06 NOTE — ED Triage Notes (Signed)
Pt presents with c/o dry throat and sore throat x 2 days. 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

## 2022-09-07 LAB — SARS CORONAVIRUS 2 (TAT 6-24 HRS): SARS Coronavirus 2: NEGATIVE

## 2022-09-09 LAB — CULTURE, GROUP A STREP (THRC)

## 2023-04-11 ENCOUNTER — Other Ambulatory Visit: Payer: Self-pay

## 2023-04-11 DIAGNOSIS — Z30011 Encounter for initial prescription of contraceptive pills: Secondary | ICD-10-CM

## 2023-04-11 MED ORDER — NORETHIN ACE-ETH ESTRAD-FE 1-20 MG-MCG PO TABS: 1.0000 | ORAL_TABLET | Freq: Every day | ORAL | 0 refills | Status: DC

## 2023-04-25 ENCOUNTER — Ambulatory Visit: Payer: No Typology Code available for payment source | Admitting: Family Medicine

## 2023-04-29 ENCOUNTER — Ambulatory Visit
Admission: EM | Admit: 2023-04-29 | Discharge: 2023-04-29 | Disposition: A | Discharge status: Home / Self Care | Payer: No Typology Code available for payment source | Attending: Internal Medicine | Admitting: Internal Medicine

## 2023-04-29 DIAGNOSIS — B3731 Acute candidiasis of vulva and vagina: Secondary | ICD-10-CM

## 2023-04-29 MED ORDER — FLUCONAZOLE 150 MG PO TABS
150.0000 mg | ORAL_TABLET | ORAL | 0 refills | Status: DC
Start: 2023-04-29 — End: 2023-05-02

## 2023-04-29 NOTE — ED Provider Notes (Signed)
[facility]    CSN: [id number] Arrival date & time: 04/29/23  1110      History   Chief Complaint Chief Complaint  Patient presents with   Vaginal Itching    HPI Juline Sanderford is a 27 y.o. female.    Vaginal Itching  Vaginal itching, thick white vaginal discharge and external irritation for several days suspect she may have a yeast infection.  Admits history of same in the past.  Denies recent antibiotic use.  Denies concern for STI, dysuria, frequency, urgency, hematuria.  Her last period was in September however she is on continuous birth control.  Denies concern for pregnancy.  She would also like me to take a look at a cut on her left ankle, cut self 2 weeks ago while shaving wants to make sure it is healing fine denies fever or drainage  Past Medical History:  Diagnosis Date   Chlamydia    Kidney stones     Patient Active Problem List   Diagnosis Date Noted   Asthma 03/27/2022    History reviewed. No pertinent surgical history.  OB History     Gravida  0   Para  0   Term  0   Preterm  0   AB  0   Living  0      SAB  0   IAB  0   Ectopic  0   Multiple  0   Live Births  0            Home Medications    Prior to Admission medications   Medication Sig Start Date End Date Taking? Authorizing Provider  cetirizine (ZYRTEC ALLERGY) 10 MG tablet Take 1 tablet (10 mg total) by mouth daily. 05/01/22  Yes Wallis Bamberg, PA-C  norethindrone-ethinyl estradiol-FE (LOESTRIN FE) 1-20 MG-MCG tablet Take 1 tablet by mouth daily. 04/11/23  Yes Federico Flake, MD    Family History Family History  Problem Relation Age of Onset   Healthy Mother    Healthy Father     Social History Social History   Tobacco Use   Smoking status: Never   Smokeless tobacco: Never  Vaping Use   Vaping status: Never Used  Substance Use Topics   Alcohol use: Yes    Comment: rarely   Drug use: Not Currently    Types: Marijuana     Allergies    Patient has no known allergies.   Review of Systems Review of Systems  Constitutional:  Negative for fever.  Genitourinary:  Positive for vaginal discharge. Negative for difficulty urinating, dysuria, frequency, genital sores and vaginal pain.     Physical Exam Triage Vital Signs ED Triage Vitals  Encounter Vitals Group     BP 04/29/23 1158 105/70     Systolic BP Percentile --      Diastolic BP Percentile --      Pulse Rate 04/29/23 1158 81     Resp 04/29/23 1158 16     Temp 04/29/23 1158 98.5 F (36.9 C)     Temp Source 04/29/23 1158 Oral     SpO2 04/29/23 1158 97 %     Weight --      Height --      Head Circumference --      Peak Flow --      Pain Score 04/29/23 1156 0     Pain Loc --      Pain Education --      Exclude from [employer]  Chart --    No data found.  Updated Vital Signs BP 105/70 (BP Location: Right Arm)   Pulse 81   Temp 98.5 F (36.9 C) (Oral)   Resp 16   LMP 03/17/2023   SpO2 97%   Visual Acuity Right Eye Distance:   Left Eye Distance:   Bilateral Distance:    Right Eye Near:   Left Eye Near:    Bilateral Near:     Physical Exam Vitals and nursing note reviewed.  HENT:     Head: Normocephalic.  Cardiovascular:     Rate and Rhythm: Normal rate and regular rhythm.     Heart sounds: Normal heart sounds.  Pulmonary:     Effort: Pulmonary effort is normal.     Breath sounds: Normal breath sounds.  Abdominal:     General: Bowel sounds are normal.     Palpations: Abdomen is soft.  Skin:    General: Skin is warm and dry.     Comments: Scabbed healing linear laceration left ankle without evidence of infection  Neurological:     Mental Status: She is alert.      UC Treatments / Results  Labs (all labs ordered are listed, but only abnormal results are displayed) Labs Reviewed - No data to display  EKG   Radiology No results found.  Procedures Procedures (including critical care time)  Medications Ordered in UC Medications -  No data to display  Initial Impression / Assessment and Plan / UC Course  I have reviewed the triage vital signs and the nursing notes.  Pertinent labs & imaging results that were available during my care of the patient were reviewed by me and considered in my medical decision making (see chart for details).     27 year old with vaginal discharge and itching consistent with yeast, Rx Diflucan sent to pharmacy, home care and follow-up reviewed Continue current care of cut Final Clinical Impressions(s) / UC Diagnoses   Final diagnoses:  None   Discharge Instructions   None    ED Prescriptions   None    PDMP not reviewed this encounter.   Meliton Rattan, [place] 04/29/23 1221

## 2023-04-29 NOTE — ED Triage Notes (Signed)
Pt presents to UC w/ c/o vaginal itching, white vaginal discharge starting yesterday. Concerned for yeast infection.  Pt reports a healing cut on left ankle which occurred 2 weeks ago. She cut her leg with a razor blade accidentally.

## 2023-04-29 NOTE — Discharge Instructions (Addendum)
Over-the-counter Monistat for external irritation Follow-up with your doctor if your symptoms fail to improve

## 2023-05-02 ENCOUNTER — Ambulatory Visit
Admission: EM | Admit: 2023-05-02 | Discharge: 2023-05-02 | Disposition: A | Discharge status: Home / Self Care | Payer: No Typology Code available for payment source | Attending: Internal Medicine | Admitting: Internal Medicine

## 2023-05-02 DIAGNOSIS — R319 Hematuria, unspecified: Secondary | ICD-10-CM | POA: Diagnosis not present

## 2023-05-02 DIAGNOSIS — N760 Acute vaginitis: Secondary | ICD-10-CM | POA: Diagnosis not present

## 2023-05-02 DIAGNOSIS — Z113 Encounter for screening for infections with a predominantly sexual mode of transmission: Secondary | ICD-10-CM | POA: Insufficient documentation

## 2023-05-02 LAB — POCT URINALYSIS DIP (MANUAL ENTRY)
Bilirubin, UA: NEGATIVE
Glucose, UA: NEGATIVE mg/dL
Ketones, POC UA: NEGATIVE mg/dL
Leukocytes, UA: NEGATIVE
Nitrite, UA: NEGATIVE
Protein Ur, POC: NEGATIVE mg/dL
Spec Grav, UA: 1.02 (ref 1.010–1.025)
Urobilinogen, UA: 2 U/dL — AB
pH, UA: 7 (ref 5.0–8.0)

## 2023-05-02 LAB — POCT URINE PREGNANCY: Preg Test, Ur: NEGATIVE

## 2023-05-02 MED ORDER — FLUCONAZOLE 150 MG PO TABS: 150.0000 mg | ORAL_TABLET | Freq: Every day | ORAL | 0 refills | Status: AC

## 2023-05-02 MED ORDER — METRONIDAZOLE 500 MG PO TABS: 500.0000 mg | ORAL_TABLET | Freq: Two times a day (BID) | ORAL | 0 refills | Status: DC

## 2023-05-02 NOTE — ED Triage Notes (Signed)
Pt presents to UC w/ c/o continued "greenish vaginal discharge" since last visit 3 days ago. Pt reports feeling a burning sensation and dull lower abd pain. Reports itching is gone since taking the fluconazole. Reports hx of recurring BV.

## 2023-05-02 NOTE — ED Provider Notes (Signed)
[facility]    CSN: [id number] Arrival date & time: 05/02/23  1815      History   Chief Complaint No chief complaint on file.   HPI Katherine Bailey is a 27 y.o. female presents for vaginal discharge.  Patient was seen in urgent care on 11/3 for presumed yeast infection and was prescribed fluconazole x 2.  She took the second dose today but states she still has some itching and inflammation around the area.  She states she did a vaginal swab but provider note shows no indication or order for this.  In addition she also reports some thin watery drainage that is consistent with previous BV infections that she has had.  Denies any dysuria, fevers, nausea/vomiting, flank pain.  No STD exposure but would like screening while here.  No OTC medications have been used since onset.  No other concerns at this time.  HPI  Past Medical History:  Diagnosis Date   Chlamydia    Kidney stones     Patient Active Problem List   Diagnosis Date Noted   Asthma 03/27/2022    History reviewed. No pertinent surgical history.  OB History     Gravida  0   Para  0   Term  0   Preterm  0   AB  0   Living  0      SAB  0   IAB  0   Ectopic  0   Multiple  0   Live Births  0            Home Medications    Prior to Admission medications   Medication Sig Start Date End Date Taking? Authorizing Provider  fluconazole (DIFLUCAN) 150 MG tablet Take 1 tablet (150 mg total) by mouth daily for 1 dose. 05/05/23 05/06/23 Yes Radford Pax, NP  metroNIDAZOLE (FLAGYL) 500 MG tablet Take 1 tablet (500 mg total) by mouth 2 (two) times daily. 05/02/23  Yes Radford Pax, NP  cetirizine (ZYRTEC ALLERGY) 10 MG tablet Take 1 tablet (10 mg total) by mouth daily. 05/01/22   Wallis Bamberg, PA-C  norethindrone-ethinyl estradiol-FE (LOESTRIN FE) 1-20 MG-MCG tablet Take 1 tablet by mouth daily. 04/11/23   Federico Flake, MD    Family History Family History  Problem Relation Age of Onset    Healthy Mother    Healthy Father     Social History Social History   Tobacco Use   Smoking status: Never   Smokeless tobacco: Never  Vaping Use   Vaping status: Never Used  Substance Use Topics   Alcohol use: Yes    Comment: rarely   Drug use: Not Currently    Types: Marijuana     Allergies   Patient has no known allergies.   Review of Systems Review of Systems  Genitourinary:  Positive for vaginal discharge.     Physical Exam Triage Vital Signs ED Triage Vitals  Encounter Vitals Group     BP 05/02/23 1917 125/87     Systolic BP Percentile --      Diastolic BP Percentile --      Pulse Rate 05/02/23 1917 87     Resp 05/02/23 1917 16     Temp 05/02/23 1917 98.6 F (37 C)     Temp Source 05/02/23 1917 Oral     SpO2 05/02/23 1917 98 %     Weight --      Height --      Head Circumference --  Peak Flow --      Pain Score 05/02/23 1916 0     Pain Loc --      Pain Education --      Exclude from Growth Chart --    No data found.  Updated Vital Signs BP 125/87 (BP Location: Right Arm)   Pulse 87   Temp 98.6 F (37 C) (Oral)   Resp 16   LMP 03/17/2023   SpO2 98%   Visual Acuity Right Eye Distance:   Left Eye Distance:   Bilateral Distance:    Right Eye Near:   Left Eye Near:    Bilateral Near:     Physical Exam Vitals and nursing note reviewed.  Constitutional:      Appearance: Normal appearance.  HENT:     Head: Normocephalic and atraumatic.  Eyes:     Pupils: Pupils are equal, round, and reactive to light.  Cardiovascular:     Rate and Rhythm: Normal rate.  Pulmonary:     Effort: Pulmonary effort is normal.  Abdominal:     Tenderness: There is no right CVA tenderness or left CVA tenderness.  Skin:    General: Skin is warm and dry.  Neurological:     General: No focal deficit present.     Mental Status: She is alert and oriented to person, place, and time.  Psychiatric:        Mood and Affect: Mood normal.        Behavior:  Behavior normal.      UC Treatments / Results  Labs (all labs ordered are listed, but only abnormal results are displayed) Labs Reviewed  POCT URINALYSIS DIP (MANUAL ENTRY) - Abnormal; Notable for the following components:      Result Value   Blood, UA trace-intact (*)    Urobilinogen, UA 2.0 (*)    All other components within normal limits  URINE CULTURE  RPR  HIV ANTIBODY (ROUTINE TESTING W REFLEX)  POCT URINE PREGNANCY  CERVICOVAGINAL ANCILLARY ONLY    EKG   Radiology No results found.  Procedures Procedures (including critical care time)  Medications Ordered in UC Medications - No data to display  Initial Impression / Assessment and Plan / UC Course  I have reviewed the triage vital signs and the nursing notes.  Pertinent labs & imaging results that were available during my care of the patient were reviewed by me and considered in my medical decision making (see chart for details).     Reviewed exam and symptoms with patient.  No red flags.  Vaginal swab is ordered and will contact for any positive results.  UA with trace blood otherwise no signs of infection.  Will culture.  Urine hCG negative.  Will treat for BV with metronidazole as patient reports symptoms are consistent with this.  Will also do at 1 additional dose of Diflucan though patient will take in 3 days as she already took 1 dose today.  Lots of rest and fluids.  Patient to follow-up with GYN if symptoms do not improve.  ER precautions reviewed. Final Clinical Impressions(s) / UC Diagnoses   Final diagnoses:  Acute vaginitis  Screening examination for STD (sexually transmitted disease)  Hematuria, unspecified type     Discharge Instructions      The clinic will contact you with results of the testing done today if positive.  Start Diflucan for 1 additional dose on 11/9 as you already took a dose today.  Also start metronidazole twice daily for  7 days to treat potential BV infection.  Please  follow-up with your gynecologist or PCP if symptoms do not improve.  Please go to the ER for any worsening symptoms.  I hope you feel better soon!     ED Prescriptions     Medication Sig Dispense Auth. Provider   fluconazole (DIFLUCAN) 150 MG tablet Take 1 tablet (150 mg total) by mouth daily for 1 dose. 1 tablet Radford Pax, NP   metroNIDAZOLE (FLAGYL) 500 MG tablet Take 1 tablet (500 mg total) by mouth 2 (two) times daily. 14 tablet Radford Pax, NP      PDMP not reviewed this encounter.   Radford Pax, NP 05/02/23 1939

## 2023-05-02 NOTE — Discharge Instructions (Addendum)
The clinic will contact you with results of the testing done today if positive.  Start Diflucan for 1 additional dose on 11/9 as you already took a dose today.  Also start metronidazole twice daily for 7 days to treat potential BV infection.  Please follow-up with your gynecologist or PCP if symptoms do not improve.  Please go to the ER for any worsening symptoms.  I hope you feel better soon!

## 2023-05-04 LAB — SYPHILIS: RPR W/REFLEX TO RPR TITER AND TREPONEMAL ANTIBODIES, TRADITIONAL SCREENING AND DIAGNOSIS ALGORITHM: RPR Ser Ql: NONREACTIVE

## 2023-05-04 LAB — CERVICOVAGINAL ANCILLARY ONLY
Bacterial Vaginitis (gardnerella): POSITIVE — AB
Candida Glabrata: NEGATIVE
Candida Vaginitis: POSITIVE — AB
Chlamydia: NEGATIVE
Comment: NEGATIVE
Comment: NEGATIVE
Comment: NEGATIVE
Comment: NEGATIVE
Comment: NEGATIVE
Comment: NORMAL
Neisseria Gonorrhea: NEGATIVE
Trichomonas: POSITIVE — AB

## 2023-05-04 LAB — URINE CULTURE: Culture: NO GROWTH

## 2023-05-04 LAB — HIV ANTIBODY (ROUTINE TESTING W REFLEX): HIV Screen 4th Generation wRfx: NONREACTIVE

## 2023-06-29 ENCOUNTER — Ambulatory Visit
Admission: EM | Admit: 2023-06-29 | Discharge: 2023-06-29 | Disposition: A | Discharge status: Home / Self Care | Payer: No Typology Code available for payment source | Attending: Family Medicine | Admitting: Family Medicine

## 2023-06-29 DIAGNOSIS — Z3202 Encounter for pregnancy test, result negative: Secondary | ICD-10-CM

## 2023-06-29 DIAGNOSIS — Z113 Encounter for screening for infections with a predominantly sexual mode of transmission: Secondary | ICD-10-CM

## 2023-06-29 DIAGNOSIS — R300 Dysuria: Secondary | ICD-10-CM | POA: Diagnosis not present

## 2023-06-29 LAB — POCT URINALYSIS DIP (MANUAL ENTRY)
Bilirubin, UA: NEGATIVE
Glucose, UA: NEGATIVE mg/dL
Ketones, POC UA: NEGATIVE mg/dL
Leukocytes, UA: NEGATIVE
Nitrite, UA: NEGATIVE
Protein Ur, POC: NEGATIVE mg/dL
Spec Grav, UA: 1.025 (ref 1.010–1.025)
Urobilinogen, UA: 0.2 U/dL
pH, UA: 7 (ref 5.0–8.0)

## 2023-06-29 LAB — POCT URINE PREGNANCY: Preg Test, Ur: NEGATIVE

## 2023-06-29 NOTE — ED Provider Notes (Signed)
 [facility]  Note:  This document was prepared using Conservation officer, historic buildings and may include unintentional dictation errors.  MRN: [id number] DOB: Nov 17, 1995  Subjective:   Katherine Bailey is a 28 y.o. female presenting for 2-week history of persistent tingling and urinary frequency, urinary urgency.  Denies fever, n/v, abdominal pain, pelvic pain, rashes, hematuria, vaginal discharge.  Does not drink water.  Drinks soda and sweet tea.  Would like STI testing.  No current facility-administered medications for this encounter.  Current Outpatient Medications:    cetirizine  (ZYRTEC  ALLERGY) 10 MG tablet, Take 1 tablet (10 mg total) by mouth daily., Disp: 30 tablet, Rfl: 0   metroNIDAZOLE  (FLAGYL ) 500 MG tablet, Take 1 tablet (500 mg total) by mouth 2 (two) times daily., Disp: 14 tablet, Rfl: 0   norethindrone-ethinyl estradiol-FE (LOESTRIN FE) 1-20 MG-MCG tablet, Take 1 tablet by mouth daily., Disp: 84 tablet, Rfl: 0   No Known Allergies  Past Medical History:  Diagnosis Date   Chlamydia    Kidney stones      History reviewed. No pertinent surgical history.  Family History  Problem Relation Age of Onset   Healthy Mother    Healthy Father     Social History   Tobacco Use   Smoking status: Never   Smokeless tobacco: Never  Vaping Use   Vaping status: Never Used  Substance Use Topics   Alcohol use: Yes    Comment: rarely   Drug use: Not Currently    Types: Marijuana    ROS   Objective:   Vitals: BP (!) 137/97 (BP Location: Right Arm)   Pulse 78   Temp 99.4 F (37.4 C) (Oral)   Resp 16   SpO2 98%   Physical Exam Constitutional:      General: She is not in acute distress.    Appearance: Normal appearance. She is well-developed. She is not ill-appearing, toxic-appearing or diaphoretic.  HENT:     Head: Normocephalic and atraumatic.     Nose: Nose normal.     Mouth/Throat:     Mouth: Mucous membranes are moist.     Pharynx:  Oropharynx is clear.  Eyes:     General: No scleral icterus.       Right eye: No discharge.        Left eye: No discharge.     Extraocular Movements: Extraocular movements intact.     Conjunctiva/sclera: Conjunctivae normal.  Cardiovascular:     Rate and Rhythm: Normal rate.  Pulmonary:     Effort: Pulmonary effort is normal.  Abdominal:     General: Bowel sounds are normal. There is no distension.     Palpations: Abdomen is soft. There is no mass.     Tenderness: There is no abdominal tenderness. There is no right CVA tenderness, left CVA tenderness, guarding or rebound.  Skin:    General: Skin is warm and dry.  Neurological:     General: No focal deficit present.     Mental Status: She is alert and oriented to person, place, and time.  Psychiatric:        Mood and Affect: Mood normal.        Behavior: Behavior normal.        Thought Content: Thought content normal.        Judgment: Judgment normal.     Results for orders placed or performed during the hospital encounter of 06/29/23 (from the past 24 hours)  POCT urine pregnancy  Status: None   Collection Time: 06/29/23  7:55 PM  Result Value Ref Range   Preg Test, Ur Negative Negative  POCT urinalysis dipstick     Status: Abnormal   Collection Time: 06/29/23  7:55 PM  Result Value Ref Range   Color, UA yellow yellow   Clarity, UA clear clear   Glucose, UA negative negative mg/dL   Bilirubin, UA negative negative   Ketones, POC UA negative negative mg/dL   Spec Grav, UA 8.974 8.989 - 1.025   Blood, UA moderate (A) negative   pH, UA 7.0 5.0 - 8.0   Protein Ur, POC negative negative mg/dL   Urobilinogen, UA 0.2 0.2 or 1.0 E.U./dL   Nitrite, UA Negative Negative   Leukocytes, UA Negative Negative    Assessment and Plan :   PDMP not reviewed this encounter.  1. Dysuria   2. Screen for STD (sexually transmitted disease)    Urine culture and STI check pending.  Emphasized need to hydrate much more consistently,  avoid urinary irritants.  Will defer empiric treatment.  Counseled patient on potential for adverse effects with medications prescribed/recommended today, ER and return-to-clinic precautions discussed, patient verbalized understanding.    Christopher Savannah, [place] 06/30/23 [phone]

## 2023-06-29 NOTE — Discharge Instructions (Addendum)
Make sure you hydrate very well with plain water and a quantity of 80 ounces of water a day.  Please limit drinks that are considered urinary irritants such as soda, sweet tea, coffee, energy drinks, alcohol.  These can worsen your urinary and genital symptoms but also be the source of them.  I will let you know about your urine culture results through MyChart to see if we need to prescribe or change your antibiotics based off of those results.  

## 2023-06-29 NOTE — ED Triage Notes (Signed)
 Pt c/o "tingling after I pee" and urinary freq 12/23-denies abnormal vaginal d/c-requesting STD testing-NAD-steady gait

## 2023-07-01 LAB — RPR: RPR Ser Ql: NONREACTIVE

## 2023-07-01 LAB — HIV ANTIBODY (ROUTINE TESTING W REFLEX): HIV Screen 4th Generation wRfx: NONREACTIVE

## 2023-07-01 LAB — URINE CULTURE: Culture: NO GROWTH

## 2023-07-02 ENCOUNTER — Other Ambulatory Visit: Payer: Self-pay | Admitting: Family Medicine

## 2023-07-02 DIAGNOSIS — Z30011 Encounter for initial prescription of contraceptive pills: Secondary | ICD-10-CM

## 2023-07-02 LAB — CERVICOVAGINAL ANCILLARY ONLY
Chlamydia: NEGATIVE
Comment: NEGATIVE
Comment: NEGATIVE
Comment: NORMAL
Neisseria Gonorrhea: NEGATIVE
Trichomonas: NEGATIVE

## 2023-07-17 NOTE — Telephone Encounter (Signed)
Patient called in wanting to know if we can refill her bc pills. She was suppose to have her annual in January but we were unable to get her scheduled until Feb.

## 2023-07-18 ENCOUNTER — Telehealth: Payer: Self-pay | Admitting: Internal Medicine

## 2023-07-18 NOTE — Telephone Encounter (Signed)
Patient requesting a RX refill at least enough to last until her appointment 07-30-2023.  LOESTRIN FE)

## 2023-07-24 NOTE — Telephone Encounter (Signed)
Left message that I am returning her call and sorry for the delay.  If she continues have questions or concerns to please give the office a call back.    Leonette Nutting

## 2023-07-30 ENCOUNTER — Other Ambulatory Visit: Payer: Self-pay

## 2023-07-30 ENCOUNTER — Ambulatory Visit (INDEPENDENT_AMBULATORY_CARE_PROVIDER_SITE_OTHER): Payer: PRIVATE HEALTH INSURANCE | Admitting: Obstetrics and Gynecology

## 2023-07-30 ENCOUNTER — Encounter: Payer: Self-pay | Admitting: Obstetrics and Gynecology

## 2023-07-30 VITALS — BP 121/82 | HR 99 | Wt 150.7 lb

## 2023-07-30 DIAGNOSIS — Z1331 Encounter for screening for depression: Secondary | ICD-10-CM | POA: Diagnosis not present

## 2023-07-30 DIAGNOSIS — Z30011 Encounter for initial prescription of contraceptive pills: Secondary | ICD-10-CM | POA: Diagnosis not present

## 2023-07-30 DIAGNOSIS — Z01419 Encounter for gynecological examination (general) (routine) without abnormal findings: Secondary | ICD-10-CM

## 2023-07-30 MED ORDER — NORETHIN ACE-ETH ESTRAD-FE 1-20 MG-MCG PO TABS: 1.0000 | ORAL_TABLET | Freq: Every day | ORAL | 11 refills | Status: DC

## 2023-07-30 NOTE — Progress Notes (Signed)
ANNUAL EXAM Patient name: Katherine Bailey MRN [id number]  Date of birth: 05/23/1996 Chief Complaint:   Gynecologic Exam  History of Present Illness:   Katherine Bailey is a 28 y.o. G0P0000  female being seen today for a routine annual exam.  Current complaints: Doing well overall. Has a regular monthly cycle. Doing well on OCP, desires refill. Denies vaginal or pelvic complaints. Had blood work and swabs collected recently.   No LMP recorded. (Menstrual status: Oral contraceptives).   Upstream - 07/30/23 1104       Pregnancy Intention Screening   Does the patient want to become pregnant in the next year? No    Does the patient's partner want to become pregnant in the next year? No    Would the patient like to discuss contraceptive options today? No      Contraception Wrap Up   Current Method Oral Contraceptive    End Method Oral Contraceptive    How was the end contraceptive method provided? Prescription            The pregnancy intention screening data noted above was reviewed. Potential methods of contraception were discussed. The patient elected to proceed with Oral Contraceptive.   Last pap 2024. Results were: NILM H/O abnormal pap: no Last mammogram: n/a. Results were: N/A. Family h/o breast cancer: n/a      07/30/2023   11:28 AM 07/11/2022    8:52 AM  Depression screen PHQ 2/9  Decreased Interest 0 0  Down, Depressed, Hopeless 0 0  PHQ - 2 Score 0 0  Altered sleeping 0 0  Tired, decreased energy 0 0  Change in appetite 0 0  Feeling bad or failure about yourself  0 0  Trouble concentrating 0 0  Moving slowly or fidgety/restless 0 0  Suicidal thoughts 0 0  PHQ-9 Score 0 0        07/30/2023   11:28 AM 07/11/2022    8:52 AM  GAD 7 : Generalized Anxiety Score  Nervous, Anxious, on Edge 0 0  Control/stop worrying 0 0  Worry too much - different things 0 0  Trouble relaxing 0 0  Restless 0 0  Easily annoyed or irritable 0 0  Afraid - awful might happen 0 0  Total  GAD 7 Score 0 0     Review of Systems:   Pertinent items are noted in HPI Denies any headaches, blurred vision, fatigue, shortness of breath, chest pain, abdominal pain, abnormal vaginal discharge/itching/odor/irritation, problems with periods, bowel movements, urination, or intercourse unless otherwise stated above. Pertinent History Reviewed:  Reviewed past medical,surgical, social and family history.  Reviewed problem list, medications and allergies. Physical Assessment:   Vitals:   07/30/23 1105  BP: 121/82  Pulse: 99  Weight: 150 lb 11.2 oz (68.4 kg)  Body mass index is 29.43 kg/m.        Physical Examination:   General appearance - well appearing, and in no distress  Mental status - alert, oriented to person, place, and time  Psych:  She has a normal mood and affect  Skin - warm and dry, normal color  Chest - effort normal, all lung fields clear to auscultation bilaterally  Heart - normal rate and regular rhythm  Neck:  midline trachea  Breasts - breasts appear normal, no suspicious masses, no skin or nipple changes or  axillary nodes  Abdomen - soft  Pelvic - referred  Extremities:  No swelling or varicosities noted  Chaperone present for exam  No results found for this or any previous visit (from the past 24 hours).  Assessment & Plan:  1. Well woman exam with routine gynecological exam (Primary) UTD on pap Encouraged self breast exam Discussed future pregnancy planning, when to stop OCP, when to start prenatal and tracking cycles, timed intercourse  - Hepatitis B Surface AntiGEN - Hepatitis C Antibody  2. Encounter for initial prescription of contraceptive pills Refill sent, per insurance needs monthly supply  - norethindrone-ethinyl estradiol-FE (BLISOVI FE 1/20) 1-20 MG-MCG tablet; Take 1 tablet by mouth daily.  Dispense: 28 tablet; Refill: 11  Labs/procedures today:   Mammogram: @ 28yo, or sooner if problems   Orders Placed This Encounter  Procedures    Hepatitis B Surface AntiGEN   Hepatitis C Antibody    Meds:  Meds ordered this encounter  Medications   norethindrone-ethinyl estradiol-FE (BLISOVI FE 1/20) 1-20 MG-MCG tablet    Sig: Take 1 tablet by mouth daily.    Dispense:  28 tablet    Refill:  11    Follow-up: Return in about 1 year (around 07/29/2024) for Thayer Jew, FNP

## 2023-07-31 LAB — HEPATITIS B SURFACE ANTIGEN: Hepatitis B Surface Ag: NEGATIVE

## 2023-07-31 LAB — HEPATITIS C ANTIBODY: Hep C Virus Ab: NONREACTIVE

## 2023-10-23 IMAGING — CT CT RENAL STONE PROTOCOL
3 of 4 series · 7 of 46 positions shown, 13 images · non-contrast
Comparison: August 23, 2021

CLINICAL DATA: Right flank pain.



[Series 4: lungs · axial · 0.61mm/px · z∈[+1130,+1165]mm · 3 of 8 slices shown, 7 images]
[im 1/8  soft-tissue]
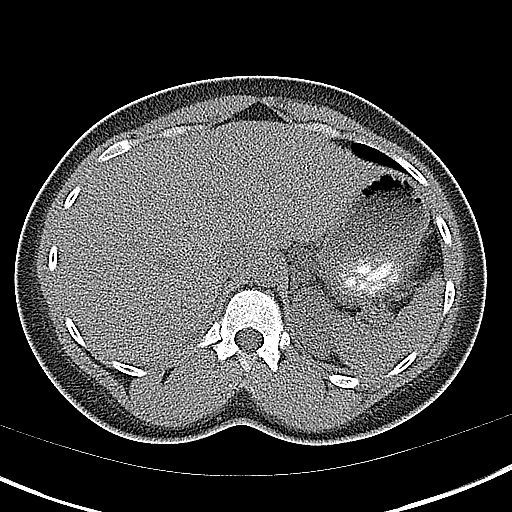
[im 1/8  lung]
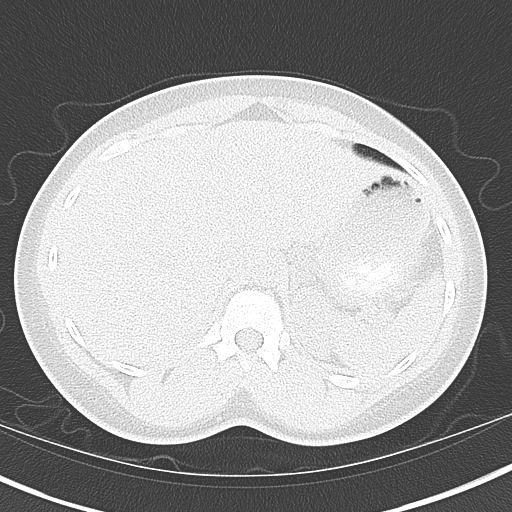
[im 1/8  bone]
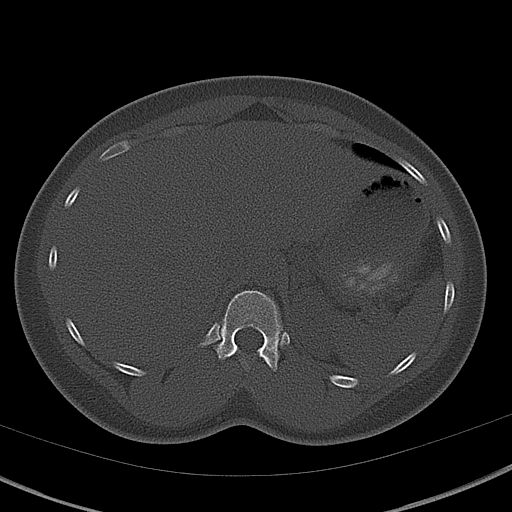
[im 4/8  soft-tissue]
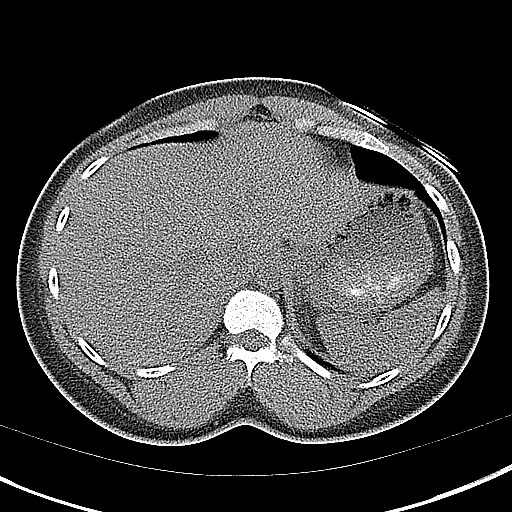
[im 4/8  lung]
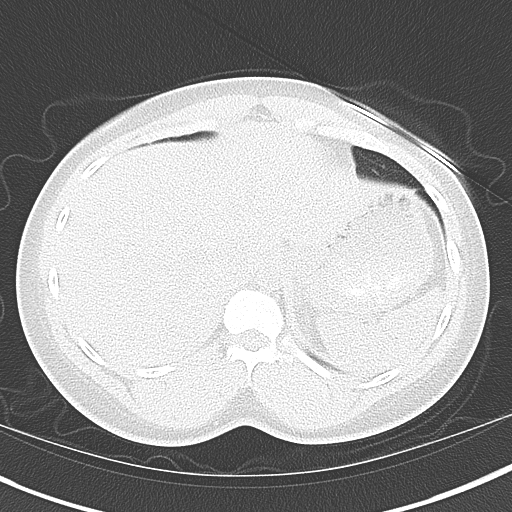
[im 8/8  soft-tissue]
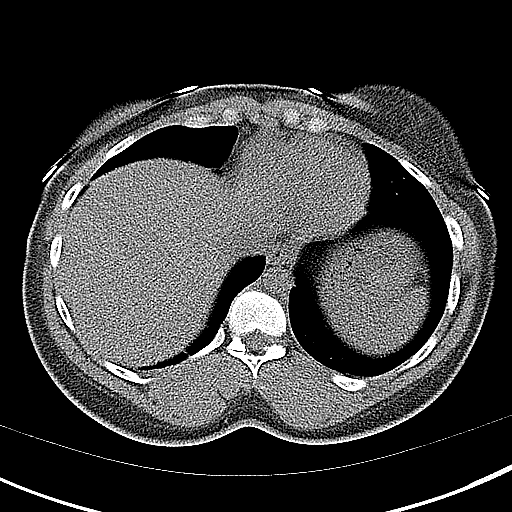
[im 8/8  lung]
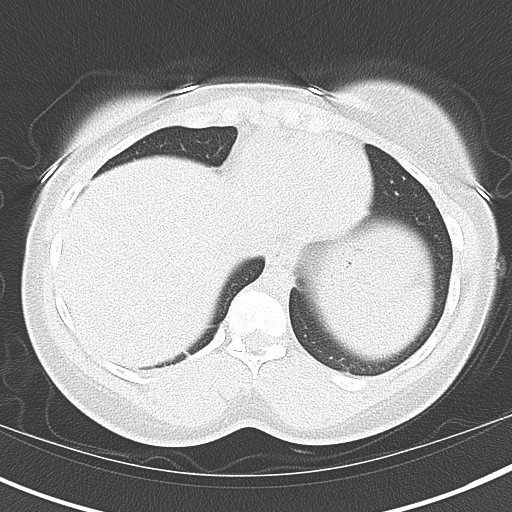

[Series 5: coronal · coronal · 0.78mm/px · 3 of 90 slices shown, 4 images]
[im 30/90  soft-tissue]
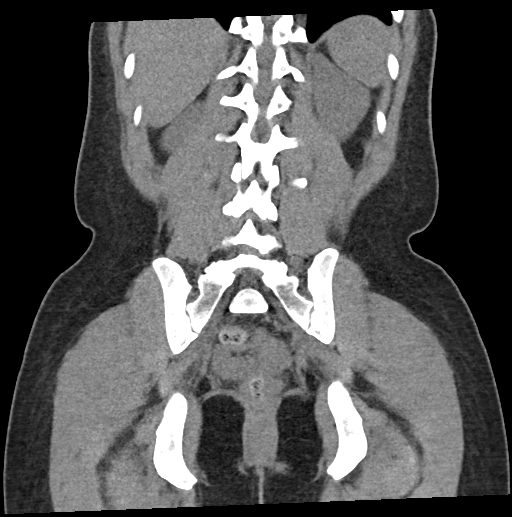
[im 40/90  soft-tissue]
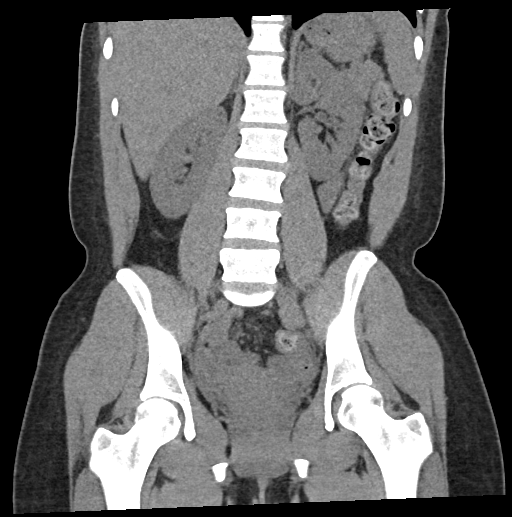
[im 40/90  bone]
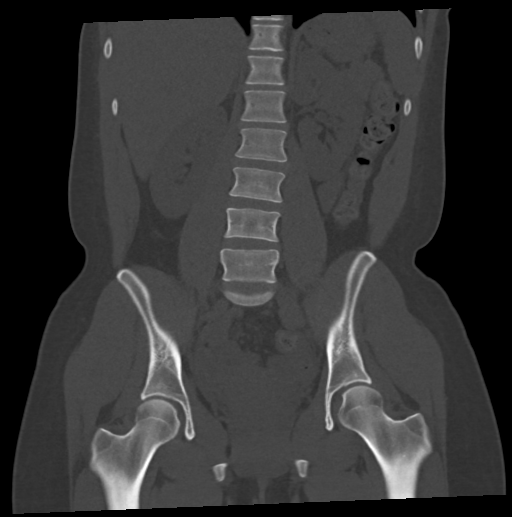
[im 50/90  soft-tissue]
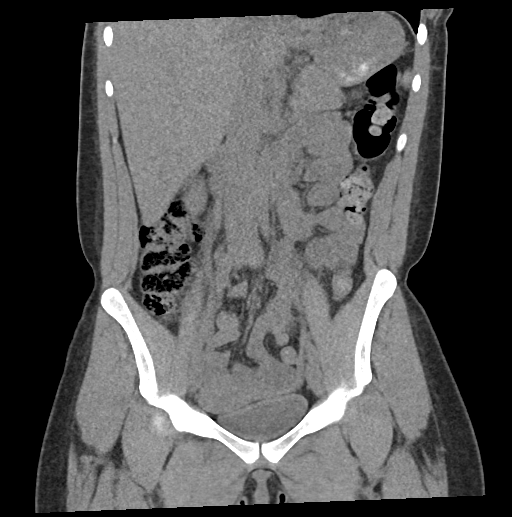

[Series 6: sagittal · sagittal · 0.52mm/px · 1 of 133 slices shown, 2 images]
[im 45/133  soft-tissue]
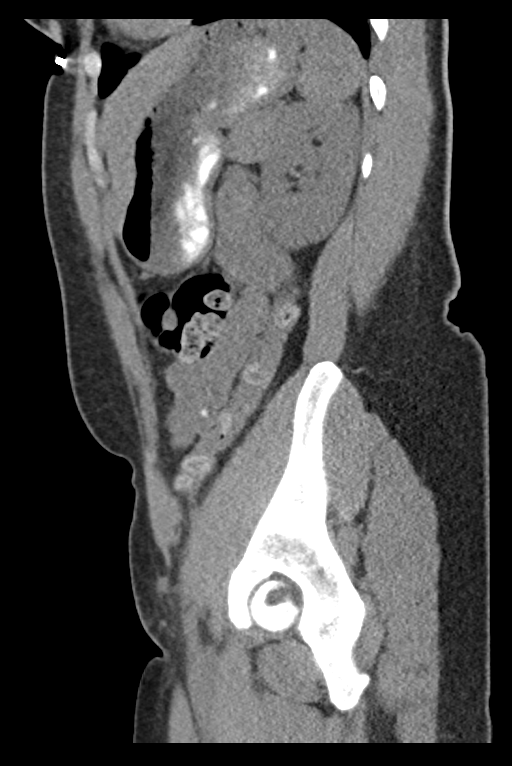
[im 45/133  bone]
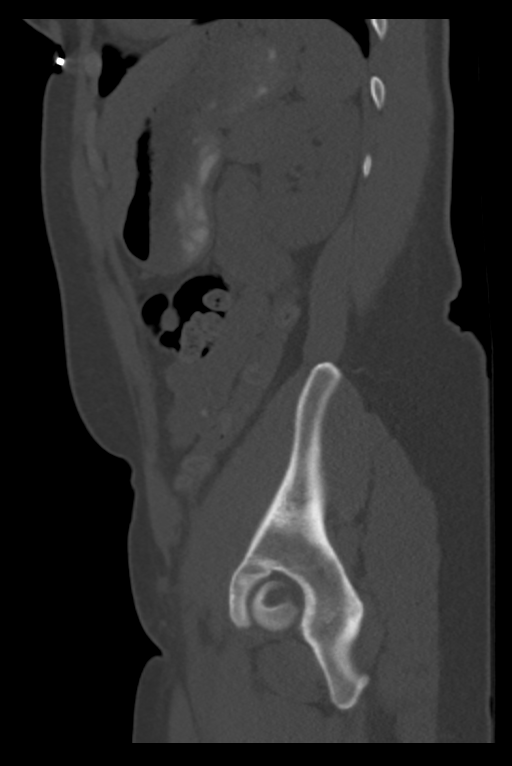

[7 of 46 positions shown; findings below may reference images not displayed]

FINDINGS: Lower chest: No acute abnormality.

Hepatobiliary: No focal liver abnormality is seen. No gallstones,
gallbladder wall thickening, or biliary dilatation.

Pancreas: Unremarkable. No pancreatic ductal dilatation or
surrounding inflammatory changes.

Spleen: Normal in size without focal abnormality.

Adrenals/Urinary Tract: Adrenal glands are unremarkable. Kidneys are
normal, without obstructing renal calculi, focal lesion, or
hydronephrosis. Multiple bilateral 2 mm and 3 mm nonobstructing
renal calculi are noted. The right ureteral calculus seen on the
prior study is no longer present. Bladder is unremarkable.

Stomach/Bowel: Stomach is within normal limits. Appendix appears
normal. No evidence of bowel wall thickening, distention, or
inflammatory changes.

Vascular/Lymphatic: No significant vascular findings are present. No
enlarged abdominal or pelvic lymph nodes.

Reproductive: Uterus and bilateral adnexa are unremarkable.

Other: No abdominal wall hernia or abnormality. No abdominopelvic
ascites.

Musculoskeletal: No acute or significant osseous findings.
IMPRESSION: Multiple bilateral subcentimeter nonobstructing renal calculi.

## 2023-11-08 ENCOUNTER — Telehealth: Payer: PRIVATE HEALTH INSURANCE | Admitting: Physician Assistant

## 2023-11-08 DIAGNOSIS — B3731 Acute candidiasis of vulva and vagina: Secondary | ICD-10-CM

## 2023-11-08 MED ORDER — FLUCONAZOLE 150 MG PO TABS: 150.0000 mg | ORAL_TABLET | Freq: Once | ORAL | 0 refills | Status: AC

## 2023-11-08 NOTE — Progress Notes (Signed)

## 2024-01-23 ENCOUNTER — Telehealth: Payer: PRIVATE HEALTH INSURANCE | Admitting: Family Medicine

## 2024-01-23 DIAGNOSIS — B9689 Other specified bacterial agents as the cause of diseases classified elsewhere: Secondary | ICD-10-CM

## 2024-01-23 DIAGNOSIS — B3731 Acute candidiasis of vulva and vagina: Secondary | ICD-10-CM

## 2024-01-23 DIAGNOSIS — N760 Acute vaginitis: Secondary | ICD-10-CM | POA: Diagnosis not present

## 2024-01-23 MED ORDER — METRONIDAZOLE 500 MG PO TABS: 500.0000 mg | ORAL_TABLET | Freq: Two times a day (BID) | ORAL | 0 refills | Status: AC

## 2024-01-23 MED ORDER — FLUCONAZOLE 150 MG PO TABS: 150.0000 mg | ORAL_TABLET | Freq: Every day | ORAL | 0 refills | Status: AC

## 2024-01-23 NOTE — Progress Notes (Signed)

## 2024-02-15 ENCOUNTER — Telehealth: Payer: PRIVATE HEALTH INSURANCE | Admitting: Physician Assistant

## 2024-02-15 ENCOUNTER — Telehealth: Payer: PRIVATE HEALTH INSURANCE

## 2024-02-15 DIAGNOSIS — N898 Other specified noninflammatory disorders of vagina: Secondary | ICD-10-CM

## 2024-02-15 DIAGNOSIS — N760 Acute vaginitis: Secondary | ICD-10-CM

## 2024-02-15 NOTE — Progress Notes (Signed)
  Because you have had recurrent vaginal infections frequently over the last 3 months, I feel your condition warrants further evaluation and I recommend that you be seen in a face-to-face visit for official testing at this time, especially since it has not been done recently.   NOTE: There will be NO CHARGE for this E-Visit   If you are having a true medical emergency, please call 911.     For an urgent face to face visit, [facility] has multiple urgent care centers for your convenience.  Click the link below for the full list of locations and hours, walk-in wait times, appointment scheduling options and driving directions:  Urgent Care - [place], [place], [place], [place], [place], [place]  [place]     Your MyChart E-visit questionnaire answers were reviewed by a board certified advanced clinical practitioner to complete your personal care plan based on your specific symptoms.    Thank you for using e-Visits.     I have spent 5 minutes in review of e-visit questionnaire, review and updating patient chart, medical decision making and response to patient.   Delon CHRISTELLA Dickinson, PA-C

## 2024-02-15 NOTE — Progress Notes (Signed)
  Please refer to the prior recommendations. Because of your recurrent symptoms, your condition warrants further evaluation and I recommend that you be seen in a face-to-face visit.   NOTE: There will be NO CHARGE for this E-Visit   If you are having a true medical emergency, please call 911.     For an urgent face to face visit, [place] [facility] has multiple urgent care centers for your convenience.  Click the link below for the full list of locations and hours, walk-in wait times, appointment scheduling options and driving directions:  Urgent Care - [place], [place], [place], [place], [place], [place]  [place]     Your MyChart E-visit questionnaire answers were reviewed by a board certified advanced clinical practitioner to complete your personal care plan based on your specific symptoms.    Thank you for using e-Visits.  Approximately 5 minutes was spent documenting and reviewing patient's chart.

## 2024-06-08 ENCOUNTER — Telehealth: Payer: PRIVATE HEALTH INSURANCE | Admitting: Family

## 2024-06-08 DIAGNOSIS — B3731 Acute candidiasis of vulva and vagina: Secondary | ICD-10-CM

## 2024-06-08 MED ORDER — FLUCONAZOLE 150 MG PO TABS: 150.0000 mg | ORAL_TABLET | ORAL | 0 refills | Status: DC | PRN

## 2024-06-08 NOTE — Progress Notes (Signed)

## 2024-06-11 ENCOUNTER — Telehealth: Payer: PRIVATE HEALTH INSURANCE | Admitting: Physician Assistant

## 2024-06-11 DIAGNOSIS — B9689 Other specified bacterial agents as the cause of diseases classified elsewhere: Secondary | ICD-10-CM

## 2024-06-11 MED ORDER — METRONIDAZOLE 500 MG PO TABS: 500.0000 mg | ORAL_TABLET | Freq: Two times a day (BID) | ORAL | 0 refills | Status: AC

## 2024-06-11 NOTE — Progress Notes (Signed)

## 2024-07-17 ENCOUNTER — Telehealth: Payer: PRIVATE HEALTH INSURANCE | Admitting: Physician Assistant

## 2024-07-17 DIAGNOSIS — N760 Acute vaginitis: Secondary | ICD-10-CM

## 2024-07-17 DIAGNOSIS — T3695XA Adverse effect of unspecified systemic antibiotic, initial encounter: Secondary | ICD-10-CM

## 2024-07-17 NOTE — Progress Notes (Signed)
" ° °  Thank you for the details you included in the comment boxes. Those details are very helpful in determining the best course of treatment for you and help us  to provide the best care.Because of multiple treatments for vaginitis in the past month and recurring symptoms, we recommend that you at least schedule a Virtual Urgent Care video visit in order for the provider to better assess what is going on.  The provider will be able to give you a more accurate diagnosis and treatment plan if we can more freely discuss your symptoms and with the addition of a virtual examination.   If you change your visit to a video visit, we will bill your insurance (similar to an office visit) and you will not be charged for this e-Visit. You will be able to stay at home and speak with the first available [facility] advanced practice provider. The link to do a video visit is in the drop down Menu tab of your Welcome screen in MyChart.    "

## 2024-07-18 ENCOUNTER — Telehealth: Payer: PRIVATE HEALTH INSURANCE

## 2024-07-18 ENCOUNTER — Telehealth: Payer: PRIVATE HEALTH INSURANCE | Admitting: Family Medicine

## 2024-07-18 DIAGNOSIS — B3731 Acute candidiasis of vulva and vagina: Secondary | ICD-10-CM | POA: Diagnosis not present

## 2024-07-18 MED ORDER — FLUCONAZOLE 150 MG PO TABS: 150.0000 mg | ORAL_TABLET | ORAL | 0 refills | Status: AC | PRN

## 2024-07-18 NOTE — Progress Notes (Signed)
 " Virtual Visit Consent   Katherine Bailey, you are scheduled for a virtual visit with a [facility] provider today. Just as with appointments in the office, your consent must be obtained to participate. Your consent will be active for this visit and any virtual visit you may have with one of our providers in the next 365 days. If you have a MyChart account, a copy of this consent can be sent to you electronically.  As this is a virtual visit, video technology does not allow for your provider to perform a traditional examination. This may limit your provider's ability to fully assess your condition. If your provider identifies any concerns that need to be evaluated in person or the need to arrange testing (such as labs, EKG, etc.), we will make arrangements to do so. Although advances in technology are sophisticated, we cannot ensure that it will always work on either your end or our end. If the connection with a video visit is poor, the visit may have to be switched to a telephone visit. With either a video or telephone visit, we are not always able to ensure that we have a secure connection.  By engaging in this virtual visit, you consent to the provision of healthcare and authorize for your insurance to be billed (if applicable) for the services provided during this visit. Depending on your insurance coverage, you may receive a charge related to this service.  I need to obtain your verbal consent now. Are you willing to proceed with your visit today? Rivky Clendenning has provided verbal consent on 07/18/2024 for a virtual visit (video or telephone). Loa Lamp, FNP  Date: 07/18/2024 12:48 PM   Virtual Visit via Video Note   I, Loa Lamp, connected with  Katherine Bailey  ([id number], 11-24-1995) on 07/18/24 at 12:45 PM EST by a video-enabled telemedicine application and verified that I am speaking with the correct person using two identifiers.  Location: Patient: Virtual Visit Location Patient:  Home Provider: Virtual Visit Location Provider: Home Office   I discussed the limitations of evaluation and management by telemedicine and the availability of in person appointments. The patient expressed understanding and agreed to proceed.    History of Present Illness: Katherine Bailey is a 29 y.o. who identifies as a female who was assigned female at birth, and is being seen today for recurrent BV with odor, sensitive, has yeast repeatedly also. Has yeast infection now with thick discharge and itching. She has apptmt with her GYN in March. Denies BV sx now.   HPI: HPI  Problems:  Patient Active Problem List   Diagnosis Date Noted   Asthma 03/27/2022    Allergies: Allergies[1] Medications: Current Medications[2]  Observations/Objective: Patient is well-developed, well-nourished in no acute distress.  Resting comfortably  at home.  Head is normocephalic, atraumatic.  No labored breathing.  Speech is clear and coherent with logical content.  Patient is alert and oriented at baseline.    Assessment and Plan: 1. Candidiasis of vagina (Primary) Follow up with GYN.   Follow Up Instructions: I discussed the assessment and treatment plan with the patient. The patient was provided an opportunity to ask questions and all were answered. The patient agreed with the plan and demonstrated an understanding of the instructions.  A copy of instructions were sent to the patient via MyChart unless otherwise noted below.     The patient was advised to call back or seek an in-person evaluation if the symptoms worsen or if the condition fails  to improve as anticipated.    [name], FNP     [1] No Known Allergies [2]  Current Outpatient Medications:    fluconazole  (DIFLUCAN ) 150 MG tablet, Take 1 tablet (150 mg total) by mouth every three (3) days as needed., Disp: 3 tablet, Rfl: 0   norethindrone-ethinyl estradiol-FE (BLISOVI FE 1/20) 1-20 MG-MCG tablet, Take 1 tablet by mouth daily., Disp:  28 tablet, Rfl: 11   salmeterol (SEREVENT ) 50 MCG/ACT diskus inhaler, Inhale 1 puff into the lungs 2 (two) times daily. (Patient not taking: Reported on 07/30/2023), Disp: , Rfl:   "

## 2024-07-18 NOTE — Patient Instructions (Signed)

## 2024-07-25 ENCOUNTER — Other Ambulatory Visit: Payer: Self-pay | Admitting: Obstetrics and Gynecology

## 2024-07-25 DIAGNOSIS — Z30011 Encounter for initial prescription of contraceptive pills: Secondary | ICD-10-CM

## 2024-08-25 ENCOUNTER — Ambulatory Visit: Payer: Self-pay | Admitting: Obstetrics and Gynecology
